# Patient Record
Sex: Female | Born: 1960
Health system: Southern US, Community
[De-identification: ages and names within clinical notes are randomized; demographics above are authoritative.]

## PROBLEM LIST (undated history)

## (undated) DIAGNOSIS — K7460 Unspecified cirrhosis of liver: Secondary | ICD-10-CM

## (undated) DIAGNOSIS — N200 Calculus of kidney: Secondary | ICD-10-CM

## (undated) DIAGNOSIS — F419 Anxiety disorder, unspecified: Secondary | ICD-10-CM

## (undated) DIAGNOSIS — N1330 Unspecified hydronephrosis: Secondary | ICD-10-CM

## (undated) DIAGNOSIS — R17 Unspecified jaundice: Secondary | ICD-10-CM

## (undated) DIAGNOSIS — K754 Autoimmune hepatitis: Secondary | ICD-10-CM

## (undated) DIAGNOSIS — N12 Tubulo-interstitial nephritis, not specified as acute or chronic: Secondary | ICD-10-CM

## (undated) HISTORY — DX: Unspecified jaundice: R17

## (undated) HISTORY — DX: Unspecified cirrhosis of liver: K74.60

## (undated) HISTORY — DX: Autoimmune hepatitis: K75.4

## (undated) HISTORY — DX: Tubulo-interstitial nephritis, not specified as acute or chronic: N12

## (undated) HISTORY — DX: Calculus of kidney: N20.0

## (undated) HISTORY — DX: Unspecified hydronephrosis: N13.30

## (undated) HISTORY — PX: LITHOTRIPSY: SUR834

---

## 1999-08-03 ENCOUNTER — Emergency Department (HOSPITAL_COMMUNITY): Admission: EM | Admit: 1999-08-03 | Discharge: 1999-08-03 | Payer: Self-pay | Admitting: Emergency Medicine

## 1999-08-03 ENCOUNTER — Encounter: Payer: Self-pay | Admitting: Emergency Medicine

## 1999-09-11 ENCOUNTER — Other Ambulatory Visit: Admission: RE | Admit: 1999-09-11 | Discharge: 1999-09-11 | Payer: Self-pay | Admitting: Obstetrics & Gynecology

## 2007-11-24 ENCOUNTER — Emergency Department (HOSPITAL_COMMUNITY): Admission: EM | Admit: 2007-11-24 | Discharge: 2007-11-24 | Payer: Self-pay | Admitting: Emergency Medicine

## 2007-11-26 ENCOUNTER — Inpatient Hospital Stay (HOSPITAL_COMMUNITY): Admission: RE | Admit: 2007-11-26 | Discharge: 2007-11-28 | Payer: Self-pay | Admitting: Urology

## 2007-12-11 ENCOUNTER — Ambulatory Visit (HOSPITAL_COMMUNITY): Admission: RE | Admit: 2007-12-11 | Discharge: 2007-12-11 | Payer: Self-pay | Admitting: Urology

## 2008-01-27 ENCOUNTER — Ambulatory Visit: Payer: Self-pay | Admitting: Internal Medicine

## 2008-01-27 DIAGNOSIS — H6980 Other specified disorders of Eustachian tube, unspecified ear: Secondary | ICD-10-CM

## 2008-01-27 DIAGNOSIS — N200 Calculus of kidney: Secondary | ICD-10-CM

## 2008-01-27 DIAGNOSIS — R030 Elevated blood-pressure reading, without diagnosis of hypertension: Secondary | ICD-10-CM | POA: Insufficient documentation

## 2011-05-15 NOTE — Op Note (Signed)
[name], [name] NO.:  [id number]   MEDICAL RECORD NO.:  [id number]          PATIENT TYPE:  OIB   LOCATION:  1415                         FACILITY:  [facility]   PHYSICIAN:  Ronald L. Earlene Plater, M.D.  DATE OF BIRTH:  [birth year]   DATE OF PROCEDURE:  11/25/2007  DATE OF DISCHARGE:                               OPERATIVE REPORT   DIAGNOSIS:  Left ureterolithiasis, with hydronephrosis and urosepsis.   OPERATIVE PROCEDURE:  Cystourethroscopy, placement of left double-J  stent, culture and sensitivity left renal pelvic urine.   SURGEON:  Lucrezia Starch. Earlene Plater, M.D.   ANESTHESIA:  LMA.   ESTIMATED BLOOD LOSS:  Negligible.   TUBES:  24 cm 7-French Contour double-pigtail stent without a string.   COMPLICATIONS:  None.   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  She was seen in the emergency room last night,  found have a 20,000 white blood cell count, was given Cipro, and the  situation worsened.  She had a fever and subsequently increased to 102  degrees Fahrenheit.  A CT scan had been performed last night and was  reviewed.  She had an upper ureteral calculus which was approximately 5  mm, and had significant bilateral nephrocalcinosis and had a  hydronephrosis on the left side.  After understanding risks, benefits,  and alternatives, she has elected to proceed with cystoscopy, placement  of left double-J stent, and IV antibiotics.  She was given Rocephin in  the office.   PROCEDURE IN DETAIL:  The patient was placed in the supine position.  After proper LMA anesthesia, she was placed in the dorsal lithotomy  position, prepped and draped with Betadine in a sterile fashion.  Cystourethroscopy was performed with a 22.5 [place] Olympus panendoscope.  The bladder was carefully inspected and noted to be without lesions.  The ureteral orifices were normal  in location, and efflux of clear urine  was noted from the right ureteral orifice.  Under fluoroscopic guidance,  a 0.038-French sensor wire was placed in the left renal pelvis, and a 6-  [place] open-ended catheter was placed past what appeared to be the  impacted 5 mm stone in the upper ureter.  Purulent material was obtained  from the left kidney, and a hydronephrotic drip was noted.  Approximately 15 mL was sent for culture and sensitivity although she  had been on antibiotics previously.  Under fluoroscopic guidance, a 24  cm, 7-French Contour double-pigtail stent was placed and noted to have  good position within the left renal pelvis  and the bladder.  No pullout string was attached.  Again, purulent  material noted to be flooding from the stent.  The bladder was drained.  The panendoscope was removed, and the patient was taken to the recovery  room stable.   PLAN:  Will be secondary treatment a stone after the urosepsis is  resolved.      Ronald L. Earlene Plater, M.D.  Electronically Signed     RLD/MEDQ  D:  11/25/2007  T:  11/26/2007  Job:  [id number]

## 2011-10-09 LAB — CULTURE, BLOOD (ROUTINE X 2)
Culture: NO GROWTH
Culture: NO GROWTH

## 2011-10-09 LAB — COMPREHENSIVE METABOLIC PANEL
ALT: 44 — ABNORMAL HIGH
AST: 40 — ABNORMAL HIGH
CO2: 23
Calcium: 8.3 — ABNORMAL LOW
Chloride: 108
Creatinine, Ser: 1.16
GFR calc Af Amer: >60
GFR calc non Af Amer: 51 — ABNORMAL LOW
Glucose, Bld: 121 — ABNORMAL HIGH
Sodium: 141
Total Bilirubin: 0.9

## 2011-10-09 LAB — DIFFERENTIAL
Basophils Absolute: 0
Eosinophils Relative: 0
Lymphocytes Relative: 1 — ABNORMAL LOW
Lymphs Abs: 0.2 — ABNORMAL LOW
Monocytes Absolute: 0.4
Neutro Abs: 19.6 — ABNORMAL HIGH

## 2011-10-09 LAB — URINE MICROSCOPIC-ADD ON

## 2011-10-09 LAB — BASIC METABOLIC PANEL
BUN: 15
BUN: 9
CO2: 24
Calcium: 9.3
Chloride: 108
Creatinine, Ser: 0.96
Creatinine, Ser: 1.19
GFR calc Af Amer: 54 — ABNORMAL LOW
GFR calc non Af Amer: 44 — ABNORMAL LOW
GFR calc non Af Amer: >60
Glucose, Bld: 132 — ABNORMAL HIGH
Glucose, Bld: 149 — ABNORMAL HIGH
Potassium: 3.2 — ABNORMAL LOW
Potassium: 3.2 — ABNORMAL LOW
Potassium: 3.9
Sodium: 141

## 2011-10-09 LAB — URINE CULTURE
Colony Count: >=100000
Special Requests: POSITIVE

## 2011-10-09 LAB — CBC
HCT: 33 — ABNORMAL LOW
HCT: 34.5 — ABNORMAL LOW
HCT: 43.5
Hemoglobin: 13.7
Hemoglobin: 14.9
MCHC: 34.5
MCHC: 34.5
MCV: 94.8
MCV: 95
Platelets: 66 — ABNORMAL LOW
Platelets: 73 — ABNORMAL LOW
RBC: 4.19
RBC: 4.54
RDW: 12.9
RDW: 13.9
WBC: 18.9 — ABNORMAL HIGH
WBC: 20.3 — ABNORMAL HIGH

## 2011-10-09 LAB — URINALYSIS, ROUTINE W REFLEX MICROSCOPIC
Bilirubin Urine: NEGATIVE
Glucose, UA: NEGATIVE
Hgb urine dipstick: NEGATIVE
Ketones, ur: 15 — AB
Leukocytes, UA: NEGATIVE
Nitrite: NEGATIVE
Protein, ur: 30 — AB
Specific Gravity, Urine: 1.02
Urobilinogen, UA: 1
pH: 7.5

## 2011-10-09 LAB — TOBRAMYCIN LEVEL, RANDOM: Tobramycin Rm: 4.1

## 2011-10-09 LAB — HCG, SERUM, QUALITATIVE: Preg, Serum: NEGATIVE

## 2014-09-06 ENCOUNTER — Emergency Department (HOSPITAL_COMMUNITY): Payer: 59

## 2014-09-06 ENCOUNTER — Encounter (HOSPITAL_COMMUNITY): Payer: Self-pay | Admitting: Emergency Medicine

## 2014-09-06 ENCOUNTER — Inpatient Hospital Stay (HOSPITAL_COMMUNITY)
Admission: EM | Admit: 2014-09-06 | Discharge: 2014-09-08 | DRG: 690 | Disposition: A | Discharge status: Home / Self Care | Payer: 59 | Attending: Internal Medicine | Admitting: Internal Medicine

## 2014-09-06 DIAGNOSIS — Z800 Family history of malignant neoplasm of digestive organs: Secondary | ICD-10-CM | POA: Diagnosis not present

## 2014-09-06 DIAGNOSIS — K7460 Unspecified cirrhosis of liver: Secondary | ICD-10-CM | POA: Diagnosis present

## 2014-09-06 DIAGNOSIS — R17 Unspecified jaundice: Secondary | ICD-10-CM | POA: Diagnosis not present

## 2014-09-06 DIAGNOSIS — N200 Calculus of kidney: Secondary | ICD-10-CM | POA: Diagnosis present

## 2014-09-06 DIAGNOSIS — N1330 Unspecified hydronephrosis: Secondary | ICD-10-CM | POA: Diagnosis present

## 2014-09-06 DIAGNOSIS — R932 Abnormal findings on diagnostic imaging of liver and biliary tract: Secondary | ICD-10-CM

## 2014-09-06 DIAGNOSIS — N132 Hydronephrosis with renal and ureteral calculous obstruction: Secondary | ICD-10-CM

## 2014-09-06 DIAGNOSIS — R030 Elevated blood-pressure reading, without diagnosis of hypertension: Secondary | ICD-10-CM | POA: Diagnosis present

## 2014-09-06 DIAGNOSIS — R7402 Elevation of levels of lactic acid dehydrogenase (LDH): Secondary | ICD-10-CM

## 2014-09-06 DIAGNOSIS — N12 Tubulo-interstitial nephritis, not specified as acute or chronic: Secondary | ICD-10-CM | POA: Diagnosis not present

## 2014-09-06 DIAGNOSIS — K759 Inflammatory liver disease, unspecified: Secondary | ICD-10-CM

## 2014-09-06 DIAGNOSIS — R740 Nonspecific elevation of levels of transaminase and lactic acid dehydrogenase [LDH]: Secondary | ICD-10-CM

## 2014-09-06 LAB — COMPREHENSIVE METABOLIC PANEL
ALT: 932 U/L — AB (ref 0–35)
ANION GAP: 12 (ref 5–15)
AST: 1051 U/L — ABNORMAL HIGH (ref 0–37)
Albumin: 3.5 g/dL (ref 3.5–5.2)
Alkaline Phosphatase: 174 U/L — ABNORMAL HIGH (ref 39–117)
BUN: 8 mg/dL (ref 6–23)
CALCIUM: 9.1 mg/dL (ref 8.4–10.5)
CO2: 24 meq/L (ref 19–32)
CREATININE: 0.82 mg/dL (ref 0.50–1.10)
Chloride: 101 mEq/L (ref 96–112)
GFR, EST NON AFRICAN AMERICAN: 81 mL/min — AB (ref >90)
GLUCOSE: 108 mg/dL — AB (ref 70–99)
Potassium: 3.7 mEq/L (ref 3.7–5.3)
SODIUM: 137 meq/L (ref 137–147)
TOTAL PROTEIN: 9.9 g/dL — AB (ref 6.0–8.3)
Total Bilirubin: 10.5 mg/dL — ABNORMAL HIGH (ref 0.3–1.2)

## 2014-09-06 LAB — CBC WITH DIFFERENTIAL/PLATELET
Basophils Absolute: 0.1 10*3/uL (ref 0.0–0.1)
Basophils Relative: 1 % (ref 0–1)
EOS ABS: 0.1 10*3/uL (ref 0.0–0.7)
EOS PCT: 3 % (ref 0–5)
HEMATOCRIT: 36.6 % (ref 36.0–46.0)
Hemoglobin: 13.1 g/dL (ref 12.0–15.0)
LYMPHS ABS: 0.8 10*3/uL (ref 0.7–4.0)
Lymphocytes Relative: 19 % (ref 12–46)
MCH: 32.2 pg (ref 26.0–34.0)
MCHC: 35.8 g/dL (ref 30.0–36.0)
MCV: 89.9 fL (ref 78.0–100.0)
MONO ABS: 0.4 10*3/uL (ref 0.1–1.0)
Monocytes Relative: 9 % (ref 3–12)
Neutro Abs: 2.9 10*3/uL (ref 1.7–7.7)
Neutrophils Relative %: 68 % (ref 43–77)
PLATELETS: 245 10*3/uL (ref 150–400)
RBC: 4.07 MIL/uL (ref 3.87–5.11)
RDW: 15.2 % (ref 11.5–15.5)
WBC: 4.3 10*3/uL (ref 4.0–10.5)

## 2014-09-06 LAB — URINALYSIS, ROUTINE W REFLEX MICROSCOPIC
Glucose, UA: NEGATIVE mg/dL
KETONES UR: 15 mg/dL — AB
NITRITE: POSITIVE — AB
PROTEIN: 30 mg/dL — AB
Specific Gravity, Urine: 1.013 (ref 1.005–1.030)
UROBILINOGEN UA: 1 mg/dL (ref 0.0–1.0)
pH: 7 (ref 5.0–8.0)

## 2014-09-06 LAB — URINE MICROSCOPIC-ADD ON

## 2014-09-06 LAB — LIPASE, BLOOD: LIPASE: 31 U/L (ref 11–59)

## 2014-09-06 MED ORDER — IOHEXOL 300 MG/ML  SOLN
25.0000 mL | INTRAMUSCULAR | Status: AC
  Administered 2014-09-06: 25 mL via ORAL

## 2014-09-06 MED ORDER — IOHEXOL 300 MG/ML  SOLN
100.0000 mL | Freq: Once | INTRAMUSCULAR | Status: AC | PRN
  Administered 2014-09-06: 100 mL via INTRAVENOUS

## 2014-09-06 MED ORDER — DEXTROSE 5 % IV SOLN
1.0000 g | Freq: Once | INTRAVENOUS | Status: AC
  Administered 2014-09-06: 1 g via INTRAVENOUS
  Filled 2014-09-06: qty 10

## 2014-09-06 MED ORDER — SODIUM CHLORIDE 0.9 % IV BOLUS (SEPSIS)
500.0000 mL | INTRAVENOUS | Status: AC
  Administered 2014-09-06: 500 mL via INTRAVENOUS

## 2014-09-06 NOTE — ED Notes (Signed)
Per pt sts she noticed that she has been jaundice over the past few days. sts she has been having nausea and abdominal pain after eating.

## 2014-09-06 NOTE — H&P (Signed)
[facility] History and Physical  SACHE SANE ZOX:[id number] DOB: Jul 20, 1961 DOA: 09/06/2014  Referring physician: ED physician PCP: No PCP Per Patient  Specialists:   Chief Complaint: Jaundice HPI: Rose Williams is a 53 y.o. female with PMH of kidney stone (p/s lithotripsy and stent placement), who presents with jaundice.  Patient has been doing well without taking any medications at home. In the past 2 or 3 days, she noticed that her skin and eyes become yellow. She does not have any fever, chills, nausea, vomiting and abdominal pain. No hx of blood transfusion. She came to ED for further evaluation. CT-abdomen showed changes consistent with hepatic cirrhosis with mild splenic enlargement.   CT-abdomen also showed possible severe pyelonephritis and ureteritis with left kidney hydronephrosis and multiple stones. Her creatinine is normal on BMP. Patient denies dysuria or burning on urination, but the reporting mild irritation on urination. UA is positive for UTI. Per radiology, infiltrating neoplasm or lymphoma is on the differential list. CMP showed elevation of AST, ALT, total bilirubin and ALP.  Ceftriaxone was started by ED, and patient is admitted to MedSurg bed for further evaluation.  Review of Systems: As presented in the history of presenting illness, rest negative.  Where does patient live?  Lives at home with her husbanc [place] [address] Can patient participate in ADLs? Yes  Allergy:  Allergies  Allergen Reactions  . Other Itching    Itching from melons, cantaloupe, eggplant  . Pseudoephedrine Other (See Comments)    tachycardia    History reviewed. No pertinent past medical history.  Past Surgical History  Procedure Laterality Date  . Lithotripsy      2008    Social History:  reports that she has never smoked. She does not have any smokeless tobacco history on file. She reports that she does not drink alcohol. Not using drug.  Family History:   Family History  Problem Relation Age of Onset  . Cancer Mother     liver cancer, died of liver cancer at age of 52  . Cystic fibrosis Child     2 daughter and 1 son  . Kidney Stones      runging in her family, including 2 brothers and 2 sisters     Prior to Admission medications   Not on File    Physical Exam: Filed Vitals:   09/06/14 2000 09/06/14 2015 09/06/14 2030 09/06/14 2356  BP: 150/93 132/83 132/84 149/92  Pulse: 78 90 90 86  Temp:      TempSrc:      Resp:      SpO2: 100% 100% 100%    General: Not in acute distress HEENT:       Eyes: PERRL, EOMI, positive for scleral icterus       ENT: No discharge from the ears and nose, no pharynx injection, no tonsillar enlargement.        Neck: No JVD, no bruit, no mass felt. Cardiac: S1/S2, RRR, No murmurs, gallops or rubs Pulm: Good air movement bilaterally. Clear to auscultation bilaterally. No rales, wheezing, rhonchi or rubs. Abd: Soft, nondistended, nontender, no rebound pain, no organomegaly, BS present. No CVA tenderness. No Murphy sign Ext: No edema. 2+DP/PT pulse bilaterally Musculoskeletal: No joint deformities, erythema, or stiffness, ROM full Skin: No rashes.  Neuro: Alert and oriented X3, cranial nerves II-XII grossly intact, muscle strength 5/5 in all extremeties, sensation to light touch intact. Brachial reflex 2+ bilaterally. Knee reflex 1+ bilaterally. Negative Psych: Patient is not  psychotic, no suicidal or hemocidal ideation.  Labs on Admission:  Basic Metabolic Panel:  Recent Labs Lab 09/06/14 1603  NA 137  K 3.7  CL 101  CO2 24  GLUCOSE 108*  BUN 8  CREATININE 0.82  CALCIUM 9.1   Liver Function Tests:  Recent Labs Lab 09/06/14 1603  AST 1051*  ALT 932*  ALKPHOS 174*  BILITOT 10.5*  PROT 9.9*  ALBUMIN 3.5    Recent Labs Lab 09/06/14 1603  LIPASE 31   No results found for this basename: AMMONIA,  in the last 168 hours CBC:  Recent Labs Lab 09/06/14 1603  WBC 4.3  NEUTROABS  2.9  HGB 13.1  HCT 36.6  MCV 89.9  PLT 245   Cardiac Enzymes: No results found for this basename: CKTOTAL, CKMB, CKMBINDEX, TROPONINI,  in the last 168 hours  BNP (last 3 results) No results found for this basename: PROBNP,  in the last 8760 hours CBG: No results found for this basename: GLUCAP,  in the last 168 hours  Radiological Exams on Admission: Ct Abdomen Pelvis W Contrast  09/06/2014   CLINICAL DATA:  Nausea. Hepatic dysfunction. Jaundice. Pain and nausea after eating.  EXAM: CT ABDOMEN AND PELVIS WITH CONTRAST  TECHNIQUE: Multidetector CT imaging of the abdomen and pelvis was performed using the standard protocol following bolus administration of intravenous contrast.  CONTRAST:  OMNIPAQUE IOHEXOL 300 MG/ML  SOLN  COMPARISON:  11/24/2007  FINDINGS: Lung bases are clear.  Cirrhotic configuration of the liver with enlarged lateral segment of the left lobe and nodular contour. No focal liver lesions demonstrated. Spleen is mildly enlarged. Small accessory spleen. The gallbladder is normal. No bile duct dilatation. Pancreas, adrenal glands, abdominal aorta, and inferior vena cava are unremarkable. The kidneys are distinctly abnormal. The right kidney is diffusely enlarged with abnormal nephrogram. The renal pelvis and ureter are not distended but there is prominent wall thickening throughout the ureter and of the pelvocaliceal system with infiltration of the renal sinus fat. This does not appear to represent a discrete mass. There are small cysts present. Appearance is most likely to represent severe pyelonephritis although infiltrating neoplasm or lymphoma is not excluded. The left kidney demonstrates hydronephrosis without hydroureter. Mild thickening of the ureteral wall and pyelocaliceal system. Diffuse parenchymal atrophy. 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 Calcifications demonstrated throughout the left kidney consistent with stones and  seen on previous study. Prominent retroperitoneal lymph nodes mostly in the periaortic region and also in the celiac axis. Lymph nodes measure up to about 22 mm short axis dimension. These are nonspecific and could represent reactive or inflammatory nodes versus lymphoproliferative change or neoplasm. The stomach, small bowel, and colon are unremarkable. Contrast material flows through to the colon without evidence of bowel obstruction. No free air or free fluid in the abdomen.  Pelvis: Bladder wall is not thickened. Uterus and ovaries are not enlarged. Appendix is normal. No free or loculated pelvic fluid collections. No pelvic lymphadenopathy. No destructive bone lesions.  IMPRESSION: Kidneys and ureters are abnormal bilaterally. Right kidney demonstrates diffuse enlargement with abnormal nephrogram and with prominent wall thickening and infiltration around the right renal pelvis and ureter. Consider severe pyelonephritis and ureteritis versus infiltrating neoplasm or lymphoma.  Left kidney demonstrates hydronephrosis with multiple stones, thickening of the urothelium, and parenchymal atrophy. Changes may represent pyelonephritis or reflux nephropathy. Changes of the left may represent a more chronic stage of similar changes on the right.  Changes of hepatic cirrhosis  with mild splenic enlargement.  Nonspecific lymphadenopathy in the retroperitoneum and celiac axis.   Electronically Signed   By: Burman Nieves M.D.   On: 09/06/2014 22:40    Assessment/Plan Principal Problem:   Pyelonephritis Active Problems:   Calculus of kidney   ELEVATED BLOOD PRESSURE WITHOUT DIAGNOSIS OF HYPERTENSION   Jaundice   Hydronephrosis   1. Pyelonephritis: CT abdomen showed that patient is likely to have pyelonephritis. Patient reports that she had a history of pyelonephritis in the past possibly due to kidney stones. She did not have typical symptoms for pyelonephritis, such as tenderness over CVA and dysuria in the  past. Her urinalysis is consistent with UTI. Currently patient is hemodynamically stable, no sepsis. IV ceftriaxone was started in ED.  -will admit to regular bed - IVF - IV ceftriaxone - follow urine culture and blood culture X 2. - may consult to urology for kidney stones.  2. kidney stone and hydronephrosis: Patient has a long history of kidney stone. She had lithotripsy and stent placement in the past. Now has hydronephrosis again. Currently her renal function is fine. Per radiology, infiltrating neoplasm or lymphoma is also on the differential list. - May consult to urology or follow up with urology at discharge  3. Jaundice:  Patient has a jaundice with elevated transaminase. Patient does not have history of alcohol drinking. No history of hepatitis. The etiology is not clear. The etiology is clear.  -will get hepatitis panel to begin with - check INR - HIV ab - direct BR - UDS - may need to consult GI if work up is not revealing.   DVT ppx: SQ Heparin   Code Status: Full code Family Communication: patient's husband at bed side Disposition Plan: Admit to inpatient, Med-Surge  Rose Williams [facility] Pager [phone]  If 7PM-7AM, please contact night-coverage [email or web address] Password [facility] 09/07/2014, 12:17 AM

## 2014-09-06 NOTE — Progress Notes (Signed)
Called ED for report & said they will cal me back

## 2014-09-06 NOTE — ED Notes (Signed)
Pt to CT at this time.  Family remains at bedside. 

## 2014-09-06 NOTE — ED Provider Notes (Signed)
CSN: [id number]     Arrival date & time 09/06/14  1534 History   First MD Initiated Contact with Patient 09/06/14 1827     Chief Complaint  Patient presents with  . Jaundice     (Consider location/radiation/quality/duration/timing/severity/associated sxs/prior Treatment) HPI Patient presents with concerns of nausea, jaundice. Patient also complains of postprandial abdominal discomfort, nausea.  There is associated anorexia, though this seems secondary to fear of postprandial discomfort. Symptoms have developed over the past few days, without, exercise, activity, travel. Patient states that she is generally well. She does have a family history of hepatic cancer, mother. No clear alleviating or exacerbating factors.    History reviewed. No pertinent past medical history. History reviewed. No pertinent past surgical history. History reviewed. No pertinent family history. History  Substance Use Topics  . Smoking status: Never Smoker   . Smokeless tobacco: Not on file  . Alcohol Use: No   OB History   Grav Para Term Preterm Abortions TAB SAB Ect Mult Living                 Review of Systems  Constitutional:       Per HPI, otherwise negative  HENT:       Per HPI, otherwise negative  Respiratory:       Per HPI, otherwise negative  Cardiovascular:       Per HPI, otherwise negative  Gastrointestinal: Negative for vomiting.  Endocrine:       Negative aside from HPI  Genitourinary:       Neg aside from HPI   Musculoskeletal:       Per HPI, otherwise negative  Skin: Positive for color change.  Neurological: Negative for syncope.      Allergies  Pseudoephedrine  Home Medications   Prior to Admission medications   Not on File   BP 156/103  Pulse 130  Temp(Src) 98.5 F (36.9 C) (Oral)  Resp 18  SpO2 98% Physical Exam  Nursing note and vitals reviewed. Constitutional: She is oriented to person, place, and time. She appears well-developed and well-nourished. No  distress.  HENT:  Head: Normocephalic and atraumatic.  Eyes: Conjunctivae and EOM are normal. Scleral icterus is present.  Cardiovascular: Normal rate and regular rhythm.   Pulmonary/Chest: Effort normal and breath sounds normal. No stridor. No respiratory distress.  Abdominal: She exhibits no distension.  Musculoskeletal: She exhibits no edema.  Neurological: She is alert and oriented to person, place, and time. No cranial nerve deficit. She exhibits normal muscle tone. Coordination normal.  No asterixis  Skin: Skin is warm and dry.  Gross jaundice  Psychiatric: She has a normal mood and affect.    ED Course  Procedures (including critical care time) Labs Review Labs Reviewed  COMPREHENSIVE METABOLIC PANEL - Abnormal; Notable for the following:    Glucose, Bld 108 (*)    Total Protein 9.9 (*)    AST 1051 (*)    ALT 932 (*)    Alkaline Phosphatase 174 (*)    Total Bilirubin 10.5 (*)    GFR calc non Af Amer 81 (*)    All other components within normal limits  URINALYSIS, ROUTINE W REFLEX MICROSCOPIC - Abnormal; Notable for the following:    Color, Urine ORANGE (*)    APPearance CLOUDY (*)    Hgb urine dipstick TRACE (*)    Bilirubin Urine LARGE (*)    Ketones, ur 15 (*)    Protein, ur 30 (*)    Nitrite POSITIVE (*)  Leukocytes, UA LARGE (*)    All other components within normal limits  URINE MICROSCOPIC-ADD ON - Abnormal; Notable for the following:    Squamous Epithelial / LPF FEW (*)    All other components within normal limits  CBC WITH DIFFERENTIAL  LIPASE, BLOOD    Imaging Review Ct Abdomen Pelvis W Contrast  09/06/2014   CLINICAL DATA:  Nausea. Hepatic dysfunction. Jaundice. Pain and nausea after eating.  EXAM: CT ABDOMEN AND PELVIS WITH CONTRAST  TECHNIQUE: Multidetector CT imaging of the abdomen and pelvis was performed using the standard protocol following bolus administration of intravenous contrast.  CONTRAST:  OMNIPAQUE IOHEXOL 300 MG/ML  SOLN   COMPARISON:  11/24/2007  FINDINGS: Lung bases are clear.  Cirrhotic configuration of the liver with enlarged lateral segment of the left lobe and nodular contour. No focal liver lesions demonstrated. Spleen is mildly enlarged. Small accessory spleen. The gallbladder is normal. No bile duct dilatation. Pancreas, adrenal glands, abdominal aorta, and inferior vena cava are unremarkable. The kidneys are distinctly abnormal. The right kidney is diffusely enlarged with abnormal nephrogram. The renal pelvis and ureter are not distended but there is prominent wall thickening throughout the ureter and of the pelvocaliceal system with infiltration of the renal sinus fat. This does not appear to represent a discrete mass. There are small cysts present. Appearance is most likely to represent severe pyelonephritis although infiltrating neoplasm or lymphoma is not excluded. The left kidney demonstrates hydronephrosis without hydroureter. Mild thickening of the ureteral wall and pyelocaliceal system. Diffuse parenchymal atrophy. 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 Calcifications demonstrated throughout the left kidney consistent with stones and seen on previous study. Prominent retroperitoneal lymph nodes mostly in the periaortic region and also in the celiac axis. Lymph nodes measure up to about 22 mm short axis dimension. These are nonspecific and could represent reactive or inflammatory nodes versus lymphoproliferative change or neoplasm. The stomach, small bowel, and colon are unremarkable. Contrast material flows through to the colon without evidence of bowel obstruction. No free air or free fluid in the abdomen.  Pelvis: Bladder wall is not thickened. Uterus and ovaries are not enlarged. Appendix is normal. No free or loculated pelvic fluid collections. No pelvic lymphadenopathy. No destructive bone lesions.  IMPRESSION: Kidneys and ureters are abnormal bilaterally. Right kidney  demonstrates diffuse enlargement with abnormal nephrogram and with prominent wall thickening and infiltration around the right renal pelvis and ureter. Consider severe pyelonephritis and ureteritis versus infiltrating neoplasm or lymphoma.  Left kidney demonstrates hydronephrosis with multiple stones, thickening of the urothelium, and parenchymal atrophy. Changes may represent pyelonephritis or reflux nephropathy. Changes of the left may represent a more chronic stage of similar changes on the right.  Changes of hepatic cirrhosis with mild splenic enlargement.  Nonspecific lymphadenopathy in the retroperitoneum and celiac axis.   Electronically Signed   By: Burman Nieves M.D.   On: 09/06/2014 22:40   I reviewed the CT imaging, grouped interpretation.  I discussed imaging, and demonstrated the pictures to the patient and her husband. MDM   Patient presents with abdominal discomfort, postprandial nausea, jaundice. Patient is notably abnormal labs, indicating hepatic dysfunction, with hyperbilirubinemia. Patient also has evidence of urinary tract infection.  Patient's CT suggests either inflammatory condition, possibly with hepatobiliary dysfunction versus inflammation. Patient is hemodynamically stable aside from mild tachycardia. Patient started on antibiotics for her pyelonephritis, but required admission for further evaluation, management of her complex abdominal pain, hepatic dysfunction, kidney abnormalities.  Gerhard Munch, MD 09/06/14 [phone]

## 2014-09-06 NOTE — ED Notes (Signed)
Pt drinking contrast for CT at this time.  Family at bedside

## 2014-09-06 NOTE — ED Notes (Signed)
Admitting MD in to access pt for admission

## 2014-09-06 NOTE — ED Notes (Signed)
Pt finished drinking contrast, CT made aware.

## 2014-09-07 ENCOUNTER — Encounter (HOSPITAL_COMMUNITY): Payer: Self-pay | Admitting: Internal Medicine

## 2014-09-07 DIAGNOSIS — R740 Nonspecific elevation of levels of transaminase and lactic acid dehydrogenase [LDH]: Secondary | ICD-10-CM

## 2014-09-07 DIAGNOSIS — R17 Unspecified jaundice: Secondary | ICD-10-CM

## 2014-09-07 DIAGNOSIS — R7402 Elevation of levels of lactic acid dehydrogenase (LDH): Secondary | ICD-10-CM

## 2014-09-07 DIAGNOSIS — R932 Abnormal findings on diagnostic imaging of liver and biliary tract: Secondary | ICD-10-CM

## 2014-09-07 DIAGNOSIS — K759 Inflammatory liver disease, unspecified: Secondary | ICD-10-CM

## 2014-09-07 LAB — HEPATITIS PANEL, ACUTE
HCV Ab: NEGATIVE
HEP B C IGM: NONREACTIVE
Hep A IgM: NONREACTIVE
Hepatitis B Surface Ag: NEGATIVE

## 2014-09-07 LAB — COMPREHENSIVE METABOLIC PANEL
ALK PHOS: 141 U/L — AB (ref 39–117)
ALT: 735 U/L — AB (ref 0–35)
AST: 824 U/L — AB (ref 0–37)
Albumin: 2.8 g/dL — ABNORMAL LOW (ref 3.5–5.2)
Anion gap: 11 (ref 5–15)
BUN: 9 mg/dL (ref 6–23)
CO2: 23 meq/L (ref 19–32)
Calcium: 8.3 mg/dL — ABNORMAL LOW (ref 8.4–10.5)
Chloride: 101 mEq/L (ref 96–112)
Creatinine, Ser: 0.75 mg/dL (ref 0.50–1.10)
GLUCOSE: 94 mg/dL (ref 70–99)
Potassium: 3.7 mEq/L (ref 3.7–5.3)
SODIUM: 135 meq/L — AB (ref 137–147)
Total Bilirubin: 9.2 mg/dL — ABNORMAL HIGH (ref 0.3–1.2)
Total Protein: 8.3 g/dL (ref 6.0–8.3)

## 2014-09-07 LAB — RAPID URINE DRUG SCREEN, HOSP PERFORMED
AMPHETAMINES: NOT DETECTED
Barbiturates: NOT DETECTED
Benzodiazepines: NOT DETECTED
Cocaine: NOT DETECTED
OPIATES: NOT DETECTED
Tetrahydrocannabinol: NOT DETECTED

## 2014-09-07 LAB — BILIRUBIN, DIRECT: Bilirubin, Direct: 6.8 mg/dL — ABNORMAL HIGH (ref 0.0–0.3)

## 2014-09-07 LAB — HIV ANTIBODY (ROUTINE TESTING W REFLEX): HIV 1&2 Ab, 4th Generation: NONREACTIVE

## 2014-09-07 LAB — PROTIME-INR
INR: 1.25 (ref 0.00–1.49)
Prothrombin Time: 15.7 seconds — ABNORMAL HIGH (ref 11.6–15.2)

## 2014-09-07 MED ORDER — HEPARIN SODIUM (PORCINE) 5000 UNIT/ML IJ SOLN
5000.0000 [IU] | Freq: Three times a day (TID) | INTRAMUSCULAR | Status: DC
  Administered 2014-09-07 (×2): 5000 [IU] via SUBCUTANEOUS
  Filled 2014-09-07 (×6): qty 1

## 2014-09-07 MED ORDER — ONDANSETRON HCL 4 MG/2ML IJ SOLN: 4.0000 mg | Freq: Four times a day (QID) | INTRAMUSCULAR | Status: DC | PRN

## 2014-09-07 MED ORDER — ONDANSETRON HCL 4 MG PO TABS: 4.0000 mg | ORAL_TABLET | Freq: Four times a day (QID) | ORAL | Status: DC | PRN

## 2014-09-07 MED ORDER — SODIUM CHLORIDE 0.9 % IV SOLN: INTRAVENOUS | Status: DC

## 2014-09-07 MED ORDER — CETYLPYRIDINIUM CHLORIDE 0.05 % MT LIQD: 7.0000 mL | Freq: Two times a day (BID) | OROMUCOSAL | Status: DC

## 2014-09-07 MED ORDER — DEXTROSE 5 % IV SOLN
1.0000 g | Freq: Every day | INTRAVENOUS | Status: DC
  Administered 2014-09-07 – 2014-09-08 (×2): 1 g via INTRAVENOUS
  Filled 2014-09-07 (×2): qty 10

## 2014-09-07 MED ORDER — SODIUM CHLORIDE 0.9 % IV SOLN
INTRAVENOUS | Status: DC
  Administered 2014-09-07 – 2014-09-08 (×4): via INTRAVENOUS

## 2014-09-07 MED ORDER — ONDANSETRON HCL 4 MG/2ML IJ SOLN: 4.0000 mg | Freq: Three times a day (TID) | INTRAMUSCULAR | Status: DC | PRN

## 2014-09-07 MED ORDER — CHLORHEXIDINE GLUCONATE 0.12 % MT SOLN
15.0000 mL | Freq: Two times a day (BID) | OROMUCOSAL | Status: DC
  Administered 2014-09-07: 15 mL via OROMUCOSAL
  Filled 2014-09-07 (×6): qty 15

## 2014-09-07 NOTE — Progress Notes (Signed)
Received report from ED nurse

## 2014-09-07 NOTE — Progress Notes (Signed)
Utilization review completed.  

## 2014-09-07 NOTE — Progress Notes (Signed)
NURSING PROGRESS NOTE  Rose Williams [id number] Admission Data: 09/07/2014 2:42 AM Attending Provider: Eduard Clos, MD PCP:No PCP Per Patient Code Status: full code   Rose Williams is a 53 y.o. female patient admitted from ED:  -No acute distress noted.  -No complaints of shortness of breath.  -No complaints of chest pain.   Cardiac Monitoring: Box # N/A in place. Cardiac monitor yields;N/A.  Blood pressure 150/92, pulse 89, temperature 98.5 F (36.9 C), temperature source Oral, resp. rate 15, height  (1.676 m), weight 56.7 kg (125 lb), SpO2 100.00%.   IV Fluids:  IV in place, occlusive dsg intact without redness, IV cath antecubital left, condition patent and no redness normal saline.  CC/HR  Allergies:  Other and Pseudoephedrine  Past Medical History:   has no past medical history on file.  Past Surgical History:   has past surgical history that includes Lithotripsy.  Social History:   reports that she has never smoked. She does not have any smokeless tobacco history on file. She reports that she does not drink alcohol.  Skin: NSI  Patient/Family orientated to room. Information packet given to patient/family. Admission inpatient armband information verified with patient/family to include name and date of birth and placed on patient arm. Side rails up x 2, fall assessment and education completed with patient/family. Patient/family able to verbalize understanding of risk associated with falls and verbalized understanding to call for assistance before getting out of bed. Call light within reach. Patient/family able to voice and demonstrate understanding of unit orientation instructions.    Will continue to evaluate and treat per MD orders.

## 2014-09-07 NOTE — Consult Note (Signed)
Referring Provider: [facility] Primary Care Physician:  No PCP Per Patient Primary Gastroenterologist:  unassigned  Reason for Consultation:  Abnormal liver enzymes    HPI: Rose Williams is a 53 y.o. female who was admitted to the hospital yesterday with jaundice. She has a history of kidney stones and is status post lithotripsy and stent placement several years ago. She states she has felt fine since that time. 11 days ago she had a fast food restaurant and had chicken and bisquits. The following day she went out with several friends and had [place] food. Approximately 2 days after that, she began to notice that she was turning yellow. She is a Engineer, civil (consulting) and felt that her sclera were becoming yellow and her skin was turning darker. She asked her family if they notice any discoloration and he did not. She denied any complaints of fever, chills, nausea, vomiting, or abdominal pain. She denies alcohol use and denies any history of intravenous drug use or intranasal cocaine. She has no tattoos and has no history of blood transfusions .CT of the abdomen on admission showed changes consistent with hepatic cirrhosis and mild splenic enlargement. CMP showed elevation of AST, ALT, total bilirubin, and alkaline phosphatase. Creatinine was normal. She denies any recent travel outside the [place]. She has no prior history of hepatitis. She states that her mother died of liver cancer.    History reviewed. No pertinent past medical history.  Past Surgical History  Procedure Laterality Date  . Lithotripsy      2008    Prior to Admission medications   Not on File    Current Facility-Administered Medications  Medication Dose Route Frequency Provider Last Rate Last Dose  . 0.9 %  sodium chloride infusion   Intravenous Continuous Lorretta Harp, MD 100 mL/hr at 09/07/14 1127    . antiseptic oral rinse (CPC / CETYLPYRIDINIUM CHLORIDE 0.05%) solution 7 mL  7 mL Mouth Rinse q12n4p Eduard Clos, MD      . cefTRIAXone (ROCEPHIN) 1 g in dextrose 5 % 50 mL IVPB  1 g Intravenous QHS Lorretta Harp, MD      . chlorhexidine (PERIDEX) 0.12 % solution 15 mL  15 mL Mouth Rinse BID Eduard Clos, MD   15 mL at 09/07/14 0815  . heparin injection 5,000 Units  5,000 Units Subcutaneous 3 times per day Lorretta Harp, MD      . ondansetron [facility]) tablet 4 mg  4 mg Oral Q6H PRN Lorretta Harp, MD       Or  . ondansetron [facility]) injection 4 mg  4 mg Intravenous Q6H PRN Lorretta Harp, MD        Allergies as of 09/06/2014 - Review Complete 09/06/2014  Allergen Reaction Noted  . Other Itching 09/06/2014  . Pseudoephedrine Other (See Comments)     Family History  Problem Relation Age of Onset  . Cancer Mother     liver cancer, died of liver cancer at age of 62  . Cystic fibrosis Child     2 daughter and 1 son  . Kidney Stones      runging in her family, including 2 brothers and 2 sisters    History   Social History  . Marital Status: Married    Spouse Name: N/A    Number of Children: N/A  . Years of Education: N/A   Occupational History  . Not on file.   Social History Main Topics  . Smoking status: Never Smoker   .  Smokeless tobacco: Not on file  . Alcohol Use: No  . Drug Use: Not on file  . Sexual Activity: Not on file   Other Topics Concern  . Not on file   Social History Narrative  . No narrative on file    Review of Systems: Gen: Denies any fever, chills, sweats, anorexia, fatigue, weakness, malaise, weight loss, and sleep disorder CV: Denies chest pain, angina, palpitations, syncope, orthopnea, PND, peripheral edema, and claudication. Resp: Denies dyspnea at rest, dyspnea with exercise, cough, sputum, wheezing, coughing up blood, and pleurisy. GI: Denies vomiting blood, and fecal incontinence.   Denies dysphagia or odynophagia.Admits to juandice GU : Denies urinary burning, blood in urine, urinary frequency, urinary hesitancy, nocturnal urination, and urinary  incontinence. MS: Denies joint pain, limitation of movement, and swelling, stiffness, low back pain, extremity pain. Denies muscle weakness, cramps, atrophy.  Derm: Denies rash, itching, dry skin, hives, moles, warts, or unhealing ulcers.  Psych: Denies depression, anxiety, memory loss, suicidal ideation, hallucinations, paranoia, and confusion. Heme: Denies bruising, bleeding, and enlarged lymph nodes. Neuro:  Denies any headaches, dizziness, paresthesias. Endo:  Denies any problems with DM, thyroid, adrenal function.  Physical Exam: Vital signs in last 24 hours: Temp:  [98 F (36.7 C)-98.5 F (36.9 C)] 98 F (36.7 C) (09/08 0615) Pulse Rate:  [78-130] 99 (09/08 0615) Resp:  [15-18] 15 (09/07 1910) BP: (130-156)/(68-103) 130/68 mmHg (09/08 0615) SpO2:  [98 %-100 %] 100 % (09/08 0615) Weight:  [56.7 kg (125 lb)] 56.7 kg (125 lb) (09/08 0040) Last BM Date: 09/06/14 General:   Alert,  Well-developed, well-nourished, pleasant and cooperative in NAD, sclera icteric. Head:  Normocephalic and atraumatic. Eyes:  Sclera mildly icteric.   Conjunctiva pink. Ears:  Normal auditory acuity. Nose:  No deformity, discharge,  or lesions. Mouth:  No deformity or lesions.   Neck:  Supple; no masses or thyromegaly. Lungs:  Clear throughout to auscultation.   No wheezes, crackles, or rhonchi.  Heart:  Regular rate and rhythm; no murmurs, clicks, rubs,  or gallops. Abdomen:  Soft,nontender, BS active,nonpalp mass or hsm.   Rectal:  Deferred  Msk:  Symmetrical without gross deformities. . Pulses:  Normal pulses noted. Extremities:  Without clubbing or edema. Neurologic:  Alert and  oriented x4;  grossly normal neurologically. Skin:  Intact without significant lesions or rashes.. Psych:  Alert and cooperative. Normal mood and affect.    Lab Results:  Recent Labs  09/06/14 1603  WBC 4.3  HGB 13.1  HCT 36.6  PLT 245   BMET  Recent Labs  09/06/14 1603 09/07/14 0150  NA 137 135*  K 3.7  3.7  CL 101 101  CO2 24 23  GLUCOSE 108* 94  BUN 8 9  CREATININE 0.82 0.75  CALCIUM 9.1 8.3*   LFT  Recent Labs  09/07/14 0150  PROT 8.3  ALBUMIN 2.8*  AST 824*  ALT 735*  ALKPHOS 141*  BILITOT 9.2*  BILIDIR 6.8*   PT/INR  Recent Labs  09/07/14 0150  LABPROT 15.7*  INR 1.25   Hepatitis Panel  Recent Labs  09/07/14 0150  HEPBSAG NEGATIVE  HCVAB NEGATIVE  HEPAIGM NON REACTIVE  HEPBIGM NON REACTIVE   HIV  nonreactive UDS  negative LIPASE 31 Studies/Results: Ct Abdomen Pelvis W Contrast  09/06/2014   CLINICAL DATA:  Nausea. Hepatic dysfunction. Jaundice. Pain and nausea after eating.  EXAM: CT ABDOMEN AND PELVIS WITH CONTRAST  TECHNIQUE: Multidetector CT imaging of the abdomen and pelvis was performed using the  standard protocol following bolus administration of intravenous contrast.  CONTRAST:  OMNIPAQUE IOHEXOL 300 MG/ML  SOLN  COMPARISON:  11/24/2007  FINDINGS: Lung bases are clear.  Cirrhotic configuration of the liver with enlarged lateral segment of the left lobe and nodular contour. No focal liver lesions demonstrated. Spleen is mildly enlarged. Small accessory spleen. The gallbladder is normal. No bile duct dilatation. Pancreas, adrenal glands, abdominal aorta, and inferior vena cava are unremarkable. The kidneys are distinctly abnormal. The right kidney is diffusely enlarged with abnormal nephrogram. The renal pelvis and ureter are not distended but there is prominent wall thickening throughout the ureter and of the pelvocaliceal system with infiltration of the renal sinus fat. This does not appear to represent a discrete mass. There are small cysts present. Appearance is most likely to represent severe pyelonephritis although infiltrating neoplasm or lymphoma is not excluded. The left kidney demonstrates hydronephrosis without hydroureter. Mild thickening of the ureteral wall and pyelocaliceal system. Diffuse parenchymal atrophy. Changes likely represent reflux  nephropathy, probably also with pyelonephritis. Calcifications demonstrated throughout the left kidney consistent with stones and seen on previous study. Prominent retroperitoneal lymph nodes mostly in the periaortic region and also in the celiac axis. Lymph nodes measure up to about 22 mm short axis dimension. These are nonspecific and could represent reactive or inflammatory nodes versus lymphoproliferative change or neoplasm. The stomach, small bowel, and colon are unremarkable. Contrast material flows through to the colon without evidence of bowel obstruction. No free air or free fluid in the abdomen.  Pelvis: Bladder wall is not thickened. Uterus and ovaries are not enlarged. Appendix is normal. No free or loculated pelvic fluid collections. No pelvic lymphadenopathy. No destructive bone lesions.  IMPRESSION: Kidneys and ureters are abnormal bilaterally. Right kidney demonstrates diffuse enlargement with abnormal nephrogram and with prominent wall thickening and infiltration around the right renal pelvis and ureter. Consider severe pyelonephritis and ureteritis versus infiltrating neoplasm or lymphoma.  Left kidney demonstrates hydronephrosis with multiple stones, thickening of the urothelium, and parenchymal atrophy. Changes may represent pyelonephritis or reflux nephropathy. Changes of the left may represent a more chronic stage of similar changes on the right.  Changes of hepatic cirrhosis with mild splenic enlargement.  Nonspecific lymphadenopathy in the retroperitoneum and celiac axis.   Electronically Signed   By: Burman Nieves M.D.   On: 09/06/2014 22:40    IMPRESSION/PLAN: 1.Pyelonephritis. U/A consistent with UTI. Pt currently on ceftriaxone. Urine culture pending.  2. Jaundice.  Pt has elevated transaminases and a neg hepatitis panel,neg HIV.Elevated globulins suggestive of possible autoimmune process. Will await ANA, anti smooth muscle antibody, mitochondrial  antibody--(all pending). Will  recheck CMP and PT/INR tomorrow morning. If still elevated may give trial of steroid and consider liver biopsy.   Hvozdovic, Tollie Pizza PA-C 09/07/2014,   GI ATTENDING  History, laboratories, x-rays reviewed. Patient personally seen and examined. Agree with H&P as outlined above. Patient presents herself to the hospital with jaundice. Fatigue, but otherwise well. She is found to have markedly abnormal liver tests with elevated globulins. No evidence for infectious hepatitis, alcohol related disease, or drug induced. No history of hypertension or evidence for vascular insult. Suspect autoimmune hepatitis. Await serologies. Hepatic synthetic function intact. We will follow.  Wilhemina Bonito. Eda Keys., M.D. [facility] Division of Gastroenterology

## 2014-09-07 NOTE — Progress Notes (Signed)
[facility] PROGRESS NOTE  IZABEL CHIM WUJ:[id number] DOB: 04/23/1961 DOA: 09/06/2014 PCP: No PCP Per Patient Interim summary: Rose Williams is a 53 y.o. female with PMH of kidney stone (p/s lithotripsy and stent placement), who presents with jaundice. CT-abdomen also showed possible severe pyelonephritis and ureteritis with left kidney hydronephrosis and multiple stones and cirrhosis and mild splenomegaly. She was started on rocephin for pyelonephritis and gastroenterology consulted for new cirrhosis. Of note her mom died of liver cancer of unknown etiology. Auto immune hepatitis work up has been sent including anti mitochondril antibodies, ana, anti smooth muscle antibodies and anti microsomal antibodies. Ferritin levels and serum copper levels ordered.   Assessment/Plan: 1. Pyelonephritis: Normal renal function. Hydronephrosis with renal stones, non obstructing. Call urology as needed. On rocephin for UTI and urine cultures pending. Hydration and anti emetics as needed.   Jaundice/ Liver cirrhosis and mild splenomegaly: Transaminases are improving. Hepatitis work up and auto immune work up pending. ? Lymphomatous changes. GI consulted to see if she needs liver biopsy.   Diet : clear liquid and advance as tolerated.    DVT prophylaxis.     Code Status: full code Family Communication: family and friends at bedside, discussed the plan of care with the patient.  Disposition Plan: pending further investigation   Consultants:  Gastroenterology.  Procedures:  CT abdomen and pelvis.   Antibiotics:  none  HPI/Subjective: Very apprehensive about the jaundice   Objective: Filed Vitals:   09/07/14 1357  BP: 130/81  Pulse: 98  Temp: 98.5 F (36.9 C)  Resp: 15    Intake/Output Summary (Last 24 hours) at 09/07/14 1736 Last data filed at 09/07/14 1429  Gross per 24 hour  Intake   1195 ml  Output   1750 ml  Net   -555 ml   Filed Weights   09/07/14 0040   Weight: 56.7 kg (125 lb)    Exam:   General:  Alert afebrile comfortable  Cardiovascular: s1s2  Respiratory: chest clear to auscultation, no wheezing or rhonchi  Abdomen: soft non tender non distended bowel sounds heard  Musculoskeletal: no pedal edema  Skin: no spider angiomata. No rash  Neuro: alert and oriented and no focal deficits.   Data Reviewed: Basic Metabolic Panel:  Recent Labs Lab 09/06/14 1603 09/07/14 0150  NA 137 135*  K 3.7 3.7  CL 101 101  CO2 24 23  GLUCOSE 108* 94  BUN 8 9  CREATININE 0.82 0.75  CALCIUM 9.1 8.3*   Liver Function Tests:  Recent Labs Lab 09/06/14 1603 09/07/14 0150  AST 1051* 824*  ALT 932* 735*  ALKPHOS 174* 141*  BILITOT 10.5* 9.2*  PROT 9.9* 8.3  ALBUMIN 3.5 2.8*    Recent Labs Lab 09/06/14 1603  LIPASE 31   No results found for this basename: AMMONIA,  in the last 168 hours CBC:  Recent Labs Lab 09/06/14 1603  WBC 4.3  NEUTROABS 2.9  HGB 13.1  HCT 36.6  MCV 89.9  PLT 245   Cardiac Enzymes: No results found for this basename: CKTOTAL, CKMB, CKMBINDEX, TROPONINI,  in the last 168 hours BNP (last 3 results) No results found for this basename: PROBNP,  in the last 8760 hours CBG: No results found for this basename: GLUCAP,  in the last 168 hours  No results found for this or any previous visit (from the past 240 hour(s)).   Studies: Ct Abdomen Pelvis W Contrast  09/06/2014   CLINICAL DATA:  Nausea. Hepatic dysfunction. Jaundice.  Pain and nausea after eating.  EXAM: CT ABDOMEN AND PELVIS WITH CONTRAST  TECHNIQUE: Multidetector CT imaging of the abdomen and pelvis was performed using the standard protocol following bolus administration of intravenous contrast.  CONTRAST:  OMNIPAQUE IOHEXOL 300 MG/ML  SOLN  COMPARISON:  11/24/2007  FINDINGS: Lung bases are clear.  Cirrhotic configuration of the liver with enlarged lateral segment of the left lobe and nodular contour. No focal liver lesions  demonstrated. Spleen is mildly enlarged. Small accessory spleen. The gallbladder is normal. No bile duct dilatation. Pancreas, adrenal glands, abdominal aorta, and inferior vena cava are unremarkable. The kidneys are distinctly abnormal. The right kidney is diffusely enlarged with abnormal nephrogram. The renal pelvis and ureter are not distended but there is prominent wall thickening throughout the ureter and of the pelvocaliceal system with infiltration of the renal sinus fat. This does not appear to represent a discrete mass. There are small cysts present. Appearance is most likely to represent severe pyelonephritis although infiltrating neoplasm or lymphoma is not excluded. The left kidney demonstrates hydronephrosis without hydroureter. Mild thickening of the ureteral wall and pyelocaliceal system. Diffuse parenchymal atrophy. 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 Calcifications demonstrated throughout the left kidney consistent with stones and seen on previous study. Prominent retroperitoneal lymph nodes mostly in the periaortic region and also in the celiac axis. Lymph nodes measure up to about 22 mm short axis dimension. These are nonspecific and could represent reactive or inflammatory nodes versus lymphoproliferative change or neoplasm. The stomach, small bowel, and colon are unremarkable. Contrast material flows through to the colon without evidence of bowel obstruction. No free air or free fluid in the abdomen.  Pelvis: Bladder wall is not thickened. Uterus and ovaries are not enlarged. Appendix is normal. No free or loculated pelvic fluid collections. No pelvic lymphadenopathy. No destructive bone lesions.  IMPRESSION: Kidneys and ureters are abnormal bilaterally. Right kidney demonstrates diffuse enlargement with abnormal nephrogram and with prominent wall thickening and infiltration around the right renal pelvis and ureter. Consider severe pyelonephritis and  ureteritis versus infiltrating neoplasm or lymphoma.  Left kidney demonstrates hydronephrosis with multiple stones, thickening of the urothelium, and parenchymal atrophy. Changes may represent pyelonephritis or reflux nephropathy. Changes of the left may represent a more chronic stage of similar changes on the right.  Changes of hepatic cirrhosis with mild splenic enlargement.  Nonspecific lymphadenopathy in the retroperitoneum and celiac axis.   Electronically Signed   By: Burman Nieves M.D.   On: 09/06/2014 22:40    Scheduled Meds: . antiseptic oral rinse  7 mL Mouth Rinse q12n4p  . cefTRIAXone (ROCEPHIN)  IV  1 g Intravenous QHS  . chlorhexidine  15 mL Mouth Rinse BID  . heparin  5,000 Units Subcutaneous 3 times per day   Continuous Infusions: . sodium chloride 100 mL/hr at 09/07/14 1127    Principal Problem:   Pyelonephritis Active Problems:   Calculus of kidney   ELEVATED BLOOD PRESSURE WITHOUT DIAGNOSIS OF HYPERTENSION   Jaundice   Hydronephrosis    Time spent: 25 minutes.     [facility]  [facility] Pager [phone] If 7PM-7AM, please contact night-coverage at [email or web address], password [email or web address] 09/07/2014, 5:36 PM  LOS: 1 day

## 2014-09-08 ENCOUNTER — Other Ambulatory Visit: Payer: Self-pay | Admitting: Physician Assistant

## 2014-09-08 ENCOUNTER — Telehealth: Payer: Self-pay

## 2014-09-08 ENCOUNTER — Other Ambulatory Visit: Payer: Self-pay | Admitting: Nurse Practitioner

## 2014-09-08 DIAGNOSIS — R7989 Other specified abnormal findings of blood chemistry: Secondary | ICD-10-CM

## 2014-09-08 DIAGNOSIS — R945 Abnormal results of liver function studies: Secondary | ICD-10-CM

## 2014-09-08 DIAGNOSIS — N12 Tubulo-interstitial nephritis, not specified as acute or chronic: Principal | ICD-10-CM

## 2014-09-08 LAB — URINE CULTURE
Colony Count: NO GROWTH
Culture: NO GROWTH

## 2014-09-08 LAB — IGG: IgG (Immunoglobin G), Serum: 3070 mg/dL — ABNORMAL HIGH (ref 690–1700)

## 2014-09-08 LAB — COMPREHENSIVE METABOLIC PANEL
ALT: 622 U/L — AB (ref 0–35)
AST: 763 U/L — AB (ref 0–37)
Albumin: 2.4 g/dL — ABNORMAL LOW (ref 3.5–5.2)
Alkaline Phosphatase: 120 U/L — ABNORMAL HIGH (ref 39–117)
Anion gap: 8 (ref 5–15)
BUN: 9 mg/dL (ref 6–23)
CO2: 23 meq/L (ref 19–32)
CREATININE: 0.76 mg/dL (ref 0.50–1.10)
Calcium: 7.9 mg/dL — ABNORMAL LOW (ref 8.4–10.5)
Chloride: 108 mEq/L (ref 96–112)
GFR calc Af Amer: >90 mL/min (ref >90)
Glucose, Bld: 88 mg/dL (ref 70–99)
Potassium: 3.8 mEq/L (ref 3.7–5.3)
SODIUM: 139 meq/L (ref 137–147)
TOTAL PROTEIN: 7.2 g/dL (ref 6.0–8.3)
Total Bilirubin: 8.4 mg/dL — ABNORMAL HIGH (ref 0.3–1.2)

## 2014-09-08 LAB — PROTIME-INR
INR: 1.27 (ref 0.00–1.49)
PROTHROMBIN TIME: 15.9 s — AB (ref 11.6–15.2)

## 2014-09-08 LAB — ANA: Anti Nuclear Antibody(ANA): NEGATIVE

## 2014-09-08 MED ORDER — PREDNISONE 20 MG PO TABS
40.0000 mg | ORAL_TABLET | Freq: Every day | ORAL | Status: DC
  Administered 2014-09-08: 40 mg via ORAL
  Filled 2014-09-08 (×2): qty 2

## 2014-09-08 MED ORDER — ONDANSETRON HCL 4 MG PO TABS: 4.0000 mg | ORAL_TABLET | Freq: Four times a day (QID) | ORAL | Status: DC | PRN

## 2014-09-08 MED ORDER — PREDNISONE 20 MG PO TABS: 40.0000 mg | ORAL_TABLET | Freq: Every day | ORAL | Status: DC

## 2014-09-08 NOTE — Telephone Encounter (Signed)
Pt scheduled to see Dr. Marina Goodell 09/22/14@2pm . Lawson Fiscal PA to notify pt of appt.

## 2014-09-08 NOTE — Discharge Instructions (Signed)
Please go to the [facility] at [facility] [employer] in 1 week to have blood work. The lab is located in the basement.  Jaundice Jaundice is a yellowish discoloration of the skin, whites of the eyes, and mucous membranes. It is caused by increased levels of bilirubin in the blood (hyperbilirubinemia). Bilirubin is produced by the normal breakdown of red blood cells. Jaundice may mean the liver or bile system is not working normally. CAUSES  The most common causes include:  Viral hepatitis.  Gallstones.  Excess use of alcohol.  Liver disease.  Certain cancers. SYMPTOMS   Yellow color to the skin, whites of the eyes, or mucous membranes.  Dark brown colored urine.  Stomach pain.  Light or clay colored stool.  Itchy skin. DIAGNOSIS   Your history will be taken along with a physical exam.  Urine and blood tests.  Abdominal ultrasound.  CT scans.  MRI.  Liver biopsy if the liver disease is suspected.  Endoscopic retrograde cholangiopancreatography (ERCP). TREATMENT  Treatment depends on the cause or related to the treatment of an underlying condition. For example, if jaundice is caused by gallstones, the stones or gallbladder may need to be removed. Other treatments may include:  Rest.  Stopping a certain medicine if it is causing the jaundice.  Giving fluid through the vein (IV fluids).  Surgery (removing gallstones, cancers). Some conditions that cause jaundice can be fatal if not treated. HOME CARE INSTRUCTIONS   Rest.  Drink enough fluids to keep your urine clear or pale yellow.  Avoid all alcoholic drinks.  Only take over-the-counter or prescription medicines for nausea, vomiting, itching, pain, discomfort, or fever as directed by your caregiver.  If jaundice is due to viral hepatitis or an infection:  Avoid close contact with people.  Avoid preparing food for others.  Avoid sharing utensils with others.  Wash your hands often.  Keep all  follow-up appointments with your caregiver.  Use skin lotions to relieve itching. SEEK IMMEDIATE MEDICAL CARE IF:   You have increased pain.  You have repeated vomiting.  You become dehydrated.  You have a fever or persistent symptoms for more than 72 hours.  You have a fever and your symptoms suddenly get worse.  You become weak or confused.  You develop a severe headache. MAKE SURE YOU:   Understand these instructions.  Will watch your condition.  Will get help right away if you are not doing well or get worse. Document Released: 12/17/2005 Document Revised: 03/10/2012 Document Reviewed: 12/01/2010 [facility] Patient Information 2015 [place], [place]. This information is not intended to replace advice given to you by your health care provider. Make sure you discuss any questions you have with your health care provider.

## 2014-09-08 NOTE — Telephone Encounter (Signed)
Message copied by Chrystie Nose on Wed Sep 08, 2014 10:38 AM ------      Message from: Karna Christmas D      Created: Wed Sep 08, 2014 10:23 AM       Lawson Fiscal (PA)  Wants PT worked in within 2 weeks w/Dr. Marina Goodell            [phone] Call Lori Back directly ------

## 2014-09-08 NOTE — Progress Notes (Signed)
[facility] Progress Note  Subjective:   Feels well. No N/V/D. No abd pain. No pruritis. Tol reg diet. On IV antibiotics for pyelonephritis.   Objective:  Vital signs in last 24 hours: Temp:  [98 F (36.7 C)-98.8 F (37.1 C)] 98.8 F (37.1 C) (09/09 0535) Pulse Rate:  [81-98] 81 (09/09 0535) Resp:  [15-16] 16 (09/09 0535) BP: (128-130)/(79-81) 128/79 mmHg (09/09 0535) SpO2:  [99 %-100 %] 100 % (09/09 0535) Weight:  [125 lb 10.6 oz (57 kg)] 125 lb 10.6 oz (57 kg) (09/09 0535) Last BM Date: 09/07/14 General:   Alert,  Well-developed, female in NAD Heart:  Regular rate and rhythm; no murmurs Pulm lungs clear Abdomen:  Soft, nontender and nondistended. Normal bowel sounds, without guarding, and without rebound.   Extremities:  Without edema. Neurologic:  Alert and  oriented x4;  grossly normal neurologically. Psych:  Alert and cooperative. Normal mood and affect.    Lab Results: IgG (Immunoglobin G), Serum 690 - 1700 mg/dL  1610 (H)      Recent Labs  09/06/14 1603  WBC 4.3  HGB 13.1  HCT 36.6  PLT 245   BMET  Recent Labs  09/06/14 1603 09/07/14 0150 09/08/14 0603  NA 137 135* 139  K 3.7 3.7 3.8  CL 101 101 108  CO2 GLUCOSE 108* 94 88  BUN CREATININE 0.82 0.75 0.76  CALCIUM 9.1 8.3* 7.9*   LFT  Recent Labs  09/07/14 0150 09/08/14 0603  PROT 8.3 7.2  ALBUMIN 2.8* 2.4*  AST 824* 763*  ALT 735* 622*  ALKPHOS 141* 120*  BILITOT 9.2* 8.4*  BILIDIR 6.8*  --    PT/INR  Recent Labs  09/07/14 0150 09/08/14 0603  LABPROT 15.7* 15.9*  INR 1.25 1.27   Hepatitis Panel  Recent Labs  09/07/14 0150  HEPBSAG NEGATIVE  HCVAB NEGATIVE  HEPAIGM NON REACTIVE  HEPBIGM NON REACTIVE    Ct Abdomen Pelvis W Contrast  09/06/2014   CLINICAL DATA:  Nausea. Hepatic dysfunction. Jaundice. Pain and nausea after eating.  EXAM: CT ABDOMEN AND PELVIS WITH CONTRAST  TECHNIQUE: Multidetector CT imaging of the abdomen and pelvis was  performed using the standard protocol following bolus administration of intravenous contrast.  CONTRAST:  OMNIPAQUE IOHEXOL 300 MG/ML  SOLN  COMPARISON:  11/24/2007  FINDINGS: Lung bases are clear.  Cirrhotic configuration of the liver with enlarged lateral segment of the left lobe and nodular contour. No focal liver lesions demonstrated. Spleen is mildly enlarged. Small accessory spleen. The gallbladder is normal. No bile duct dilatation. Pancreas, adrenal glands, abdominal aorta, and inferior vena cava are unremarkable. The kidneys are distinctly abnormal. The right kidney is diffusely enlarged with abnormal nephrogram. The renal pelvis and ureter are not distended but there is prominent wall thickening throughout the ureter and of the pelvocaliceal system with infiltration of the renal sinus fat. This does not appear to represent a discrete mass. There are small cysts present. Appearance is most likely to represent severe pyelonephritis although infiltrating neoplasm or lymphoma is not excluded. The left kidney demonstrates hydronephrosis without hydroureter. Mild thickening of the ureteral wall and pyelocaliceal system. Diffuse parenchymal atrophy. 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 Calcifications demonstrated throughout the left kidney consistent with stones and seen on previous study. Prominent retroperitoneal lymph nodes mostly in the periaortic region and also in the celiac axis. Lymph nodes measure up to about 22 mm short axis dimension.  These are nonspecific and could represent reactive or inflammatory nodes versus lymphoproliferative change or neoplasm. The stomach, small bowel, and colon are unremarkable. Contrast material flows through to the colon without evidence of bowel obstruction. No free air or free fluid in the abdomen.  Pelvis: Bladder wall is not thickened. Uterus and ovaries are not enlarged. Appendix is normal. No free or loculated pelvic  fluid collections. No pelvic lymphadenopathy. No destructive bone lesions.  IMPRESSION: Kidneys and ureters are abnormal bilaterally. Right kidney demonstrates diffuse enlargement with abnormal nephrogram and with prominent wall thickening and infiltration around the right renal pelvis and ureter. Consider severe pyelonephritis and ureteritis versus infiltrating neoplasm or lymphoma.  Left kidney demonstrates hydronephrosis with multiple stones, thickening of the urothelium, and parenchymal atrophy. Changes may represent pyelonephritis or reflux nephropathy. Changes of the left may represent a more chronic stage of similar changes on the right.  Changes of hepatic cirrhosis with mild splenic enlargement.  Nonspecific lymphadenopathy in the retroperitoneum and celiac axis.   Electronically Signed   By: Burman Nieves M.D.   On: 09/06/2014 22:40    Assessment / Plan: 1.Pyelonephritis. U/A consistent with UTI, urine culture no growth. Further treatment per medical service. 2. Jaundice. Pt has elevated transaminases and a neg hepatitis panel,neg HIV. IgG elevation suggestive of autoimmune hepatitis. Transaminases slightly improved. ANA pending. Will start on prednisone 40 mg po qg. Pt should have repeat LFTs and PT/INR in 1 week and follow up with Dr Marina Goodell in 2 weeks.     LOS: 2 days   Hvozdovic, Lori P PA-C 09/08/2014,   GI ATTENDING  Interval history and data reviewed. Patient personally seen and examined. Agree with H&P as above. Globulins elevated. ANA negative. Other autoimmune studies pending. We will start steroids. I discussed the side effects with her. Followup LFTs next week and office visit in 2 weeks. This has been arranged. Anticipate Imuran therapy as well. Okay for discharge today.  Wilhemina Bonito. Eda Keys., M.D. [facility] Division of Gastroenterology

## 2014-09-08 NOTE — Progress Notes (Signed)
Patient discharge teaching given, including activity, diet, follow-up appoints, and medications. Patient verbalized understanding of all discharge instructions. IV access was d/c'd. Vitals are stable. Skin is intact except as charted in most recent assessments. Pt to be escorted out by NT, to be driven home by family. 

## 2014-09-08 NOTE — Discharge Summary (Signed)
Physician Discharge Summary  Rose Williams ZOX:[id number] DOB: 06/24/1961 DOA: 09/06/2014  PCP: No PCP yet, pt in process of setting up   Admit date: 09/06/2014 Discharge date: 09/08/2014  Recommendations for Outpatient Follow-up:  1. Pt will need to follow up with PCP in 2-3 weeks post discharge 2. Please obtain BMP to evaluate electrolytes and kidney function 3. Please also check CBC to evaluate Hg and Hct levels 4. Please note, case d/w with urologist, since pt is asymptomatic with no fevers and no leukocytosis, urine culture negative, no ABX recommended 5. Pt made aware no ABX needed as she was asymptomatic, symptoms of N/V/fevers discussed and pt advised to call me back if these symptoms occur so that we can treat appropriately 6. Pt also advised she will need repeat CT abd/pelvis in 6 months to ensure resolution  7. I gave patient my number where she can reach me with any questions until she is able to find PCP  Discharge Diagnoses: Jaundice  Principal Problem:   Pyelonephritis Active Problems:   Calculus of kidney   ELEVATED BLOOD PRESSURE WITHOUT DIAGNOSIS OF HYPERTENSION   Jaundice   Hydronephrosis  Discharge Condition: Stable  Diet recommendation: Heart healthy diet discussed in details   History of present illness:  53 y.o. female with PMH of kidney stone (p/s lithotripsy and stent placement), who presented with jaundice. CT-abdomen also showed possible severe pyelonephritis and ureteritis with left kidney hydronephrosis and multiple stones and cirrhosis and mild splenomegaly. She was started on rocephin for pyelonephritis and gastroenterology consulted for new cirrhosis. Of note her mom died of liver cancer of unknown etiology. Auto immune hepatitis work up has been sent including anti mitochondril antibodies, ana, anti smooth muscle antibodies and anti microsomal antibodies. Ferritin levels and serum copper levels ordered.   Hospital Course:  Principal Problem:  Jaundice - work up in process - pt feeling better this AM and bili is trending down  - pt started on Prednisone per GI and will follow up in their office  - will need to follow up on pending blood work    Pyelonephritis - pt entirely asymptomatic with no fever and no N/V, no pain - will hold off on ABX as recommended per GU specialist with need for Ct abd follow up in 6 months  Active Problems:   Calculus of kidney - no need for acute intervention at this time - d/w pt      Procedures/Studies: Ct Abdomen Pelvis W Contrast  09/06/2014    Kidneys and ureters are abnormal bilaterally. Right kidney demonstrates diffuse enlargement with abnormal nephrogram and with prominent wall thickening and infiltration around the right renal pelvis and ureter. Consider severe pyelonephritis and ureteritis versus infiltrating neoplasm or lymphoma.  Left kidney demonstrates hydronephrosis with multiple stones, thickening of the urothelium, and parenchymal atrophy. Changes may represent pyelonephritis or reflux nephropathy. Changes of the left may represent a more chronic stage of similar changes on the right.  Changes of hepatic cirrhosis with mild splenic enlargement.  Nonspecific lymphadenopathy in the retroperitoneum and celiac axis.    Consultations:  GI   Discharge Exam: Filed Vitals:   09/08/14 0535  BP: 128/79  Pulse: 81  Temp: 98.8 F (37.1 C)  Resp: 16   Filed Vitals:   09/07/14 0615 09/07/14 1357 09/07/14 2147 09/08/14 0535  BP: 130/68 130/81 128/79 128/79  Pulse: 99 98 86 81  Temp: 98 F (36.7 C) 98.5 F (36.9 C) 98 F (36.7 C) 98.8 F (37.1 C)  TempSrc: Oral Oral Oral Oral  Resp:  Height:      Weight:    57 kg (125 lb 10.6 oz)  SpO2: 100% 100% 99% 100%    General: Pt is alert, follows commands appropriately, not in acute distress Cardiovascular: Regular rate and rhythm, S1/S2 +, no murmurs, no rubs, no gallops Respiratory: Clear to auscultation bilaterally, no  wheezing, no crackles, no rhonchi Abdominal: Soft, non tender, non distended, bowel sounds +, no guarding Extremities: no edema, no cyanosis, pulses palpable bilaterally DP and PT Neuro: Grossly nonfocal  Discharge Instructions  Discharge Instructions   Diet - low sodium heart healthy    Complete by:  As directed      Increase activity slowly    Complete by:  As directed             Medication List         ondansetron 4 MG tablet  Commonly known as:  ZOFRAN  Take 1 tablet (4 mg total) by mouth every 6 (six) hours as needed for nausea.     predniSONE 20 MG tablet  Commonly known as:  DELTASONE  Take 2 tablets (40 mg total) by mouth daily with breakfast.           Follow-up Information   Follow up with Yancey Flemings, MD On 09/22/2014. (appt time 2:00--be there 15 minutes eerly.)    Specialty:  Gastroenterology   Contact information:   [address] [place] [address] [phone]       Follow up with Debbora Presto, MD. (As needed call my cell phone [phone])    Specialty:  Internal Medicine   Contact information:   [address] [place] [address] [phone]        The results of significant diagnostics from this hospitalization (including imaging, microbiology, ancillary and laboratory) are listed below for reference.     Microbiology: Recent Results (from the past 240 hour(s))  URINE CULTURE     Status: None   Collection Time    09/07/14 12:30 AM      Result Value Ref Range Status   Specimen Description URINE, CLEAN CATCH   Final   Special Requests NONE   Final   Culture  Setup Time     Final   Value: 09/07/2014 10:05     Performed at [facility] Count     Final   Value: NO GROWTH     Performed at [facility]   Culture     Final   Value: NO GROWTH     Performed at [facility]   Report Status 09/08/2014 FINAL   Final     Labs: Basic Metabolic Panel:  Recent Labs Lab  09/06/14 1603 09/07/14 0150 09/08/14 0603  NA 137 135* 139  K 3.7 3.7 3.8  CL 101 101 108  CO2 GLUCOSE 108* 94 88  BUN CREATININE 0.82 0.75 0.76  CALCIUM 9.1 8.3* 7.9*   Liver Function Tests:  Recent Labs Lab 09/06/14 1603 09/07/14 0150 09/08/14 0603  AST 1051* 824* 763*  ALT 932* 735* 622*  ALKPHOS 174* 141* 120*  BILITOT 10.5* 9.2* 8.4*  PROT 9.9* 8.3 7.2  ALBUMIN 3.5 2.8* 2.4*    Recent Labs Lab 09/06/14 1603  LIPASE 31   CBC:  Recent Labs Lab 09/06/14 1603  WBC 4.3  NEUTROABS 2.9  HGB 13.1  HCT 36.6  MCV 89.9  PLT 245   SIGNED: Time coordinating discharge: Over 30 minutes  Debbora Presto, MD  [facility] 09/08/2014, 12:41 PM Pager [phone]  If 7PM-7AM, please contact night-coverage [email or web address] Password [email or web address]

## 2014-09-09 LAB — ANTI-SMOOTH MUSCLE ANTIBODY, IGG: F-Actin IgG: 169 U — ABNORMAL HIGH (ref <20)

## 2014-09-10 LAB — ANTI-MICROSOMAL ANTIBODY LIVER / KIDNEY

## 2014-09-10 LAB — MITOCHONDRIAL ANTIBODIES: Mitochondrial M2 Ab, IgG: 0.87 (ref <0.91)

## 2014-09-15 ENCOUNTER — Other Ambulatory Visit (INDEPENDENT_AMBULATORY_CARE_PROVIDER_SITE_OTHER): Payer: 59

## 2014-09-15 DIAGNOSIS — R945 Abnormal results of liver function studies: Secondary | ICD-10-CM

## 2014-09-15 DIAGNOSIS — R7989 Other specified abnormal findings of blood chemistry: Secondary | ICD-10-CM

## 2014-09-15 LAB — COMPREHENSIVE METABOLIC PANEL
ALK PHOS: 151 U/L — AB (ref 39–117)
ALT: 594 U/L — AB (ref 0–35)
AST: 350 U/L — ABNORMAL HIGH (ref 0–37)
Albumin: 3.7 g/dL (ref 3.5–5.2)
BILIRUBIN TOTAL: 3.7 mg/dL — AB (ref 0.2–1.2)
BUN: 20 mg/dL (ref 6–23)
CO2: 27 mEq/L (ref 19–32)
Calcium: 9 mg/dL (ref 8.4–10.5)
Chloride: 100 mEq/L (ref 96–112)
Creatinine, Ser: 0.9 mg/dL (ref 0.4–1.2)
GFR: 69.01 mL/min (ref >60.00)
GLUCOSE: 88 mg/dL (ref 70–99)
POTASSIUM: 3.3 meq/L — AB (ref 3.5–5.1)
Sodium: 133 mEq/L — ABNORMAL LOW (ref 135–145)
Total Protein: 8.9 g/dL — ABNORMAL HIGH (ref 6.0–8.3)

## 2014-09-15 LAB — PROTIME-INR
INR: 1 ratio (ref 0.8–1.0)
PROTHROMBIN TIME: 11 s (ref 9.6–13.1)

## 2014-09-17 ENCOUNTER — Other Ambulatory Visit: Payer: Self-pay

## 2014-09-17 DIAGNOSIS — R7989 Other specified abnormal findings of blood chemistry: Secondary | ICD-10-CM

## 2014-09-17 DIAGNOSIS — R945 Abnormal results of liver function studies: Principal | ICD-10-CM

## 2014-09-22 ENCOUNTER — Ambulatory Visit (INDEPENDENT_AMBULATORY_CARE_PROVIDER_SITE_OTHER): Payer: 59 | Admitting: Internal Medicine

## 2014-09-22 ENCOUNTER — Other Ambulatory Visit (INDEPENDENT_AMBULATORY_CARE_PROVIDER_SITE_OTHER): Payer: 59

## 2014-09-22 ENCOUNTER — Encounter: Payer: Self-pay | Admitting: Internal Medicine

## 2014-09-22 VITALS — BP 152/96 | HR 104 | Ht 66.0 in | Wt 126.2 lb

## 2014-09-22 DIAGNOSIS — R945 Abnormal results of liver function studies: Principal | ICD-10-CM

## 2014-09-22 DIAGNOSIS — K754 Autoimmune hepatitis: Secondary | ICD-10-CM

## 2014-09-22 DIAGNOSIS — R7989 Other specified abnormal findings of blood chemistry: Secondary | ICD-10-CM

## 2014-09-22 DIAGNOSIS — K7460 Unspecified cirrhosis of liver: Secondary | ICD-10-CM

## 2014-09-22 LAB — COMPREHENSIVE METABOLIC PANEL
ALK PHOS: 128 U/L — AB (ref 39–117)
ALT: 445 U/L — AB (ref 0–35)
AST: 189 U/L — ABNORMAL HIGH (ref 0–37)
Albumin: 4 g/dL (ref 3.5–5.2)
BILIRUBIN TOTAL: 3 mg/dL — AB (ref 0.2–1.2)
BUN: 19 mg/dL (ref 6–23)
CO2: 29 meq/L (ref 19–32)
CREATININE: 1 mg/dL (ref 0.4–1.2)
Calcium: 9 mg/dL (ref 8.4–10.5)
Chloride: 98 mEq/L (ref 96–112)
GFR: 65.67 mL/min (ref >60.00)
GLUCOSE: 112 mg/dL — AB (ref 70–99)
Potassium: 3.8 mEq/L (ref 3.5–5.1)
Sodium: 132 mEq/L — ABNORMAL LOW (ref 135–145)
Total Protein: 8.9 g/dL — ABNORMAL HIGH (ref 6.0–8.3)

## 2014-09-22 LAB — HEPATIC FUNCTION PANEL
ALT: 413 U/L — ABNORMAL HIGH (ref 0–35)
AST: 195 U/L — AB (ref 0–37)
Albumin: 3.9 g/dL (ref 3.5–5.2)
Alkaline Phosphatase: 129 U/L — ABNORMAL HIGH (ref 39–117)
BILIRUBIN TOTAL: 2.7 mg/dL — AB (ref 0.2–1.2)
Bilirubin, Direct: 1.2 mg/dL — ABNORMAL HIGH (ref 0.0–0.3)
Total Protein: 9 g/dL — ABNORMAL HIGH (ref 6.0–8.3)

## 2014-09-22 NOTE — Progress Notes (Signed)
HISTORY OF PRESENT ILLNESS:  Rose Williams is a 53 y.o. female who was seen for the first time, as a hospital inpatient, approximately 2 weeks ago regarding new onset jaundice and pyelonephritis. At that time her transaminases were in the 700-800 range with alkaline phosphatase 141 and total bilirubin 9.2. Acute hepatitis serologies were negative. The patient does not use alcohol. No medications or over-the-counter agents. No suspicion of vascular insult. She did undergo contrast-enhanced CT scan of the abdomen and pelvis which in addition to neurologic abnormalities, revealed changes of hepatic cirrhosis with mild splenic enlargement. Her globulins were elevated. Autoimmune studies were positive for elevated anti-smooth muscle antibody. She was started on prednisone 40 mg daily. No liver biopsy elected. Laboratories last week showed improvement in all parameters significantly. She presents today for followup. She is accompanied by her husband. She reports feeling well. No complaints. Tolerating prednisone without issues.  REVIEW OF SYSTEMS:  All non-GI ROS negative upon review  Past Medical History  Diagnosis Date  . Kidney stones   . Pyelonephritis   . Hydronephrosis   . Jaundice     Past Surgical History  Procedure Laterality Date  . Lithotripsy      2008    Social History [name]  reports that she has never smoked. She has never used smokeless tobacco. She reports that she does not drink alcohol or use illicit drugs.  family history includes Cancer in her mother; Cystic fibrosis in her child; Kidney Stones in an other family member.  Allergies  Allergen Reactions  . Other Itching    Itching from melons, cantaloupe, eggplant  . Pseudoephedrine Other (See Comments)    tachycardia       PHYSICAL EXAMINATION: Vital signs: BP 152/96  Pulse 104  Ht  (1.676 m)  Wt 126 lb 3.2 oz (57.244 kg)  BMI 20.38 kg/m2 General: Well-developed, well-nourished, no acute  distress HEENT: Sclerae are anicteric, conjunctiva pink. Oral mucosa intact Lungs: Clear Heart: Regular Abdomen: soft, nontender, nondistended, no obvious ascites, no peritoneal signs, normal bowel sounds. No organomegaly. Extremities: No edema Psychiatric: alert and oriented x3. Cooperative     ASSESSMENT:  #1. Autoimmune hepatitis. Evidence for hepatic cirrhosis and splenomegaly on CT suggests chronic subclinical disease prior to presentation. Responding to prednisone therapy. No evidence for decompensated liver disease.   PLAN:  #1. Extensive discussion today with the patient and her husband on autoimmune hepatitis, early cirrhosis, treatment strategy and rationale, and long-term outcomes. #2. Repeat liver tests today. Returned and improved. #3. TPMT. genotype anticipating initiation of azathioprine. If genotype normal would start azathioprine 100 mg daily based on body weight. Discussed #4. Check hepatitis A and hepatitis B immune status. She will need vaccinated if she has no immunity. #5. Continue to check liver tests every 2 weeks. Taper prednisone as allowed. #6. Routine office followup in 6 weeks. Sooner if needed. Contact the office for any questions or problems.

## 2014-09-22 NOTE — Patient Instructions (Signed)
Your physician has requested that you go to the basement for the following lab work before leaving today:  TPMT  Please come to have a CBC drawn every 2 weeks starting 2 weeks from today.    Please follow up with Dr. Marina Goodell on 11/09/2014 at 1:30pm

## 2014-09-23 ENCOUNTER — Other Ambulatory Visit: Payer: Self-pay | Admitting: *Deleted

## 2014-09-23 DIAGNOSIS — K754 Autoimmune hepatitis: Secondary | ICD-10-CM

## 2014-09-24 ENCOUNTER — Telehealth: Payer: Self-pay

## 2014-09-24 DIAGNOSIS — K754 Autoimmune hepatitis: Secondary | ICD-10-CM

## 2014-09-24 NOTE — Telephone Encounter (Signed)
Message copied by Annett Fabian on Fri Sep 24, 2014 11:00 AM ------      Message from: Hilarie Fredrickson      Created: Wed Sep 22, 2014  6:09 PM      Regarding: Meredith Staggers,      The patient needs hepatitis A IgG antibody and hepatitis B surface antibody with her next blood draw. Please add ------

## 2014-09-29 ENCOUNTER — Other Ambulatory Visit: Payer: Self-pay

## 2014-09-29 LAB — THIOPURINE METHYLTRANSFERASE (TPMT), RBC: THIOPURINE METHYLTRANSFERASE, RBC: 19 (ref >12)

## 2014-09-29 MED ORDER — AZATHIOPRINE 100 MG PO TABS: 100.0000 mg | ORAL_TABLET | Freq: Every day | ORAL | Status: DC

## 2014-10-06 ENCOUNTER — Other Ambulatory Visit (INDEPENDENT_AMBULATORY_CARE_PROVIDER_SITE_OTHER): Payer: 59

## 2014-10-06 DIAGNOSIS — K754 Autoimmune hepatitis: Secondary | ICD-10-CM

## 2014-10-06 LAB — COMPREHENSIVE METABOLIC PANEL
ALT: 158 U/L — ABNORMAL HIGH (ref 0–35)
AST: 71 U/L — ABNORMAL HIGH (ref 0–37)
Albumin: 3.4 g/dL — ABNORMAL LOW (ref 3.5–5.2)
Alkaline Phosphatase: 97 U/L (ref 39–117)
BILIRUBIN TOTAL: 1.3 mg/dL — AB (ref 0.2–1.2)
BUN: 21 mg/dL (ref 6–23)
CALCIUM: 9.6 mg/dL (ref 8.4–10.5)
CO2: 23 meq/L (ref 19–32)
CREATININE: 1 mg/dL (ref 0.4–1.2)
Chloride: 101 mEq/L (ref 96–112)
GFR: 64.1 mL/min (ref >60.00)
Glucose, Bld: 155 mg/dL — ABNORMAL HIGH (ref 70–99)
Potassium: 3.5 mEq/L (ref 3.5–5.1)
Sodium: 136 mEq/L (ref 135–145)
Total Protein: 7.8 g/dL (ref 6.0–8.3)

## 2014-10-07 ENCOUNTER — Other Ambulatory Visit: Payer: Self-pay | Admitting: Internal Medicine

## 2014-10-07 ENCOUNTER — Other Ambulatory Visit: Payer: Self-pay

## 2014-10-07 DIAGNOSIS — R7989 Other specified abnormal findings of blood chemistry: Secondary | ICD-10-CM

## 2014-10-07 DIAGNOSIS — R945 Abnormal results of liver function studies: Principal | ICD-10-CM

## 2014-10-07 LAB — HEPATITIS B SURFACE ANTIBODY,QUALITATIVE: Hep B S Ab: NEGATIVE

## 2014-10-07 LAB — HEPATITIS A ANTIBODY, IGM: HEP A IGM: NONREACTIVE

## 2014-10-08 ENCOUNTER — Telehealth: Payer: Self-pay | Admitting: Internal Medicine

## 2014-10-08 MED ORDER — PREDNISONE 20 MG PO TABS: 30.0000 mg | ORAL_TABLET | Freq: Every day | ORAL | Status: DC

## 2014-10-08 NOTE — Telephone Encounter (Signed)
Refilled Prednisone per patient's request

## 2014-10-13 ENCOUNTER — Other Ambulatory Visit (INDEPENDENT_AMBULATORY_CARE_PROVIDER_SITE_OTHER): Payer: 59

## 2014-10-13 DIAGNOSIS — K754 Autoimmune hepatitis: Secondary | ICD-10-CM

## 2014-10-13 DIAGNOSIS — R7989 Other specified abnormal findings of blood chemistry: Secondary | ICD-10-CM

## 2014-10-13 DIAGNOSIS — R945 Abnormal results of liver function studies: Secondary | ICD-10-CM

## 2014-10-13 LAB — COMPREHENSIVE METABOLIC PANEL
ALBUMIN: 3.5 g/dL (ref 3.5–5.2)
ALK PHOS: 108 U/L (ref 39–117)
ALT: 116 U/L — ABNORMAL HIGH (ref 0–35)
AST: 50 U/L — AB (ref 0–37)
BILIRUBIN TOTAL: 1 mg/dL (ref 0.2–1.2)
BUN: 21 mg/dL (ref 6–23)
CO2: 26 mEq/L (ref 19–32)
Calcium: 9.1 mg/dL (ref 8.4–10.5)
Chloride: 101 mEq/L (ref 96–112)
Creatinine, Ser: 1.1 mg/dL (ref 0.4–1.2)
GFR: 55.43 mL/min — ABNORMAL LOW (ref >60.00)
Glucose, Bld: 87 mg/dL (ref 70–99)
POTASSIUM: 3.6 meq/L (ref 3.5–5.1)
Sodium: 136 mEq/L (ref 135–145)
Total Protein: 7.9 g/dL (ref 6.0–8.3)

## 2014-10-13 LAB — HEPATIC FUNCTION PANEL
ALK PHOS: 108 U/L (ref 39–117)
ALT: 116 U/L — AB (ref 0–35)
AST: 50 U/L — AB (ref 0–37)
Albumin: 3.5 g/dL (ref 3.5–5.2)
BILIRUBIN DIRECT: 0.4 mg/dL — AB (ref 0.0–0.3)
TOTAL PROTEIN: 7.9 g/dL (ref 6.0–8.3)
Total Bilirubin: 1 mg/dL (ref 0.2–1.2)

## 2014-10-14 ENCOUNTER — Other Ambulatory Visit: Payer: Self-pay

## 2014-10-14 DIAGNOSIS — R7989 Other specified abnormal findings of blood chemistry: Secondary | ICD-10-CM

## 2014-10-14 DIAGNOSIS — K754 Autoimmune hepatitis: Secondary | ICD-10-CM

## 2014-10-14 DIAGNOSIS — R945 Abnormal results of liver function studies: Principal | ICD-10-CM

## 2014-10-22 ENCOUNTER — Other Ambulatory Visit (INDEPENDENT_AMBULATORY_CARE_PROVIDER_SITE_OTHER): Payer: 59

## 2014-10-22 ENCOUNTER — Other Ambulatory Visit: Payer: Self-pay

## 2014-10-22 DIAGNOSIS — R7989 Other specified abnormal findings of blood chemistry: Secondary | ICD-10-CM

## 2014-10-22 DIAGNOSIS — R945 Abnormal results of liver function studies: Principal | ICD-10-CM

## 2014-10-22 LAB — HEPATIC FUNCTION PANEL
ALBUMIN: 3.3 g/dL — AB (ref 3.5–5.2)
ALT: 65 U/L — ABNORMAL HIGH (ref 0–35)
AST: 36 U/L (ref 0–37)
Alkaline Phosphatase: 107 U/L (ref 39–117)
BILIRUBIN TOTAL: 0.7 mg/dL (ref 0.2–1.2)
Bilirubin, Direct: 0.3 mg/dL (ref 0.0–0.3)
Total Protein: 7.3 g/dL (ref 6.0–8.3)

## 2014-10-29 ENCOUNTER — Other Ambulatory Visit (INDEPENDENT_AMBULATORY_CARE_PROVIDER_SITE_OTHER): Payer: 59

## 2014-10-29 DIAGNOSIS — R7989 Other specified abnormal findings of blood chemistry: Secondary | ICD-10-CM

## 2014-10-29 DIAGNOSIS — K754 Autoimmune hepatitis: Secondary | ICD-10-CM

## 2014-10-29 DIAGNOSIS — R945 Abnormal results of liver function studies: Secondary | ICD-10-CM

## 2014-10-29 LAB — HEPATIC FUNCTION PANEL
ALT: 62 U/L — ABNORMAL HIGH (ref 0–35)
AST: 37 U/L (ref 0–37)
Albumin: 3.5 g/dL (ref 3.5–5.2)
Alkaline Phosphatase: 82 U/L (ref 39–117)
BILIRUBIN TOTAL: 1 mg/dL (ref 0.2–1.2)
Bilirubin, Direct: 0.3 mg/dL (ref 0.0–0.3)
Total Protein: 7.3 g/dL (ref 6.0–8.3)

## 2014-10-29 LAB — COMPREHENSIVE METABOLIC PANEL
ALBUMIN: 3.5 g/dL (ref 3.5–5.2)
ALT: 62 U/L — AB (ref 0–35)
AST: 37 U/L (ref 0–37)
Alkaline Phosphatase: 82 U/L (ref 39–117)
BILIRUBIN TOTAL: 1 mg/dL (ref 0.2–1.2)
BUN: 13 mg/dL (ref 6–23)
CHLORIDE: 100 meq/L (ref 96–112)
CO2: 31 mEq/L (ref 19–32)
Calcium: 9.3 mg/dL (ref 8.4–10.5)
Creatinine, Ser: 1 mg/dL (ref 0.4–1.2)
GFR: 62.59 mL/min (ref >60.00)
GLUCOSE: 102 mg/dL — AB (ref 70–99)
Potassium: 4.4 mEq/L (ref 3.5–5.1)
SODIUM: 136 meq/L (ref 135–145)
TOTAL PROTEIN: 7.3 g/dL (ref 6.0–8.3)

## 2014-11-01 ENCOUNTER — Other Ambulatory Visit: Payer: Self-pay

## 2014-11-01 DIAGNOSIS — R7989 Other specified abnormal findings of blood chemistry: Secondary | ICD-10-CM

## 2014-11-01 DIAGNOSIS — R945 Abnormal results of liver function studies: Principal | ICD-10-CM

## 2014-11-05 ENCOUNTER — Other Ambulatory Visit: Payer: Self-pay

## 2014-11-05 ENCOUNTER — Other Ambulatory Visit (INDEPENDENT_AMBULATORY_CARE_PROVIDER_SITE_OTHER): Payer: 59

## 2014-11-05 DIAGNOSIS — K754 Autoimmune hepatitis: Secondary | ICD-10-CM

## 2014-11-05 DIAGNOSIS — R7989 Other specified abnormal findings of blood chemistry: Secondary | ICD-10-CM

## 2014-11-05 DIAGNOSIS — R945 Abnormal results of liver function studies: Principal | ICD-10-CM

## 2014-11-05 LAB — COMPREHENSIVE METABOLIC PANEL
ALBUMIN: 3.5 g/dL (ref 3.5–5.2)
ALK PHOS: 72 U/L (ref 39–117)
ALT: 58 U/L — ABNORMAL HIGH (ref 0–35)
AST: 37 U/L (ref 0–37)
BUN: 17 mg/dL (ref 6–23)
CO2: 30 meq/L (ref 19–32)
Calcium: 9.4 mg/dL (ref 8.4–10.5)
Chloride: 105 mEq/L (ref 96–112)
Creatinine, Ser: 0.9 mg/dL (ref 0.4–1.2)
GFR: 68.97 mL/min (ref >60.00)
Glucose, Bld: 93 mg/dL (ref 70–99)
POTASSIUM: 4.1 meq/L (ref 3.5–5.1)
SODIUM: 141 meq/L (ref 135–145)
TOTAL PROTEIN: 7.6 g/dL (ref 6.0–8.3)
Total Bilirubin: 1 mg/dL (ref 0.2–1.2)

## 2014-11-05 LAB — HEPATIC FUNCTION PANEL
ALBUMIN: 3.5 g/dL (ref 3.5–5.2)
ALT: 58 U/L — ABNORMAL HIGH (ref 0–35)
AST: 37 U/L (ref 0–37)
Alkaline Phosphatase: 72 U/L (ref 39–117)
Bilirubin, Direct: 0.3 mg/dL (ref 0.0–0.3)
Total Bilirubin: 1 mg/dL (ref 0.2–1.2)
Total Protein: 7.6 g/dL (ref 6.0–8.3)

## 2014-11-09 ENCOUNTER — Encounter: Payer: Self-pay | Admitting: Internal Medicine

## 2014-11-09 ENCOUNTER — Ambulatory Visit (INDEPENDENT_AMBULATORY_CARE_PROVIDER_SITE_OTHER): Payer: 59 | Admitting: Internal Medicine

## 2014-11-09 VITALS — BP 160/84 | HR 96 | Ht 66.0 in | Wt 133.5 lb

## 2014-11-09 DIAGNOSIS — K754 Autoimmune hepatitis: Secondary | ICD-10-CM

## 2014-11-09 NOTE — Progress Notes (Signed)
HISTORY OF PRESENT ILLNESS:  Rose Williams is a 53 y.o. female who was seen for the first time, as a hospital inpatient, in early September regarding new onset jaundice and pyelonephritis. At that time her transaminases were in the 700-800 range with alkaline phosphatase 141 and total bilirubin 9.2. Acute hepatitis serologies were negative. The patient does not use alcohol. She was on no medications or over-the-counter agents. No suspicion of vascular insult. She did undergo contrast-enhanced CT scan of the abdomen and pelvis which in addition to urologic abnormalities, revealed changes of hepatic cirrhosis with mild splenic enlargement. Her globulins were elevated. Autoimmune studies were positive for elevated anti-smooth muscle antibody at 169 (greater than 30 strongly positive). Other autoimmune studies were negative. She was started on prednisone 40 mg daily. No liver biopsy elected. Laboratories within 1 week showed improvement in all parameters significantly. She presented on 09/22/2014 for followup. She was accompanied by her husband. She reported feeling well. No complaints. Tolerating prednisone without issues. Since that time she has had close monitoring of her liver tests with ongoing improvement. In addition, gradual prednisone taper. Her liver tests from 4 days ago revealed mild elevation of ALT 58. Other liver tests were normal including AST of 37, alkaline phosphatase 72, total bilirubin 1.0. Total protein was 7.6 with albumin 3.5. TPMT genetics were previously obtained. She is a normal metabolizer. I recommended Imuran 100 mg daily. However, she elected not to initiate therapy (without sharing that information until today). She is currently on 10 mg of prednisone daily. She is again accompanied by her husband. She has developed cushingoid facies. No other complaints. Being a Research scientist (physical sciences)healthcare worker, she is concerned about immune suppression therapy.   REVIEW OF SYSTEMS:  All non-GI ROS negative  upon comprehensive review   Past Medical History  Diagnosis Date  . Kidney stones   . Pyelonephritis   . Hydronephrosis   . Jaundice   . Cirrhosis   . Autoimmune hepatitis     Past Surgical History  Procedure Laterality Date  . Lithotripsy      2008    Social History Rose Williams  reports that she has never smoked. She has never used smokeless tobacco. She reports that she does not drink alcohol or use illicit drugs.  family history includes Cancer in her mother; Cystic fibrosis in her child; Kidney Stones in an other family member.  Allergies  Allergen Reactions  . Dairy Aid [Lactase] Diarrhea and Nausea And Vomiting    Allergic to all dairy products  . Gluten Meal Diarrhea and Nausea And Vomiting  . Other Itching    Itching from melons, cantaloupe, eggplant  . Pseudoephedrine Other (See Comments)    tachycardia  . Whey Protein [Protein] Diarrhea and Nausea And Vomiting       PHYSICAL EXAMINATION: Vital signs: BP 160/84 mmHg  Pulse 96  Ht 5\' 6"  (1.676 m)  Wt 133 lb 8 oz (60.555 kg)  BMI 21.56 kg/m2  Constitutional: generally well-appearing, no acute distress. Cushingoid facies Psychiatric: alert and oriented x3, cooperative Eyes: extraocular movements intact, anicteric, conjunctiva pink Mouth: oral pharynx moist, no lesions Neck: supple no lymphadenopathy Cardiovascular: heart regular rate and rhythm, no murmur Lungs: clear to auscultation bilaterally Abdomen: soft, nontender, nondistended, no obvious ascites, no peritoneal signs, normal bowel sounds, no organomegaly Extremities: no lower extremity edema bilaterally Skin: no lesions on visible extremities Neuro: No focal deficits. No asterixis.   ASSESSMENT:  #1. Autoimmune hepatitis. Imaging studies suggest an element of underlying cirrhosis.  Near normalization of LFTs on prednisone. Currently 10 mg daily.The patient wonders about other diagnoses or the need for Imuran moving  forward.   PLAN:  #1. I discussed with the patient the rationale of Imuran therapy. This, particularly given the underlying suspicion of cirrhosis. We did not perform liver biopsy. I did recommend that she consider a second opinion from a tertiary specialist. I recommended Dr. Esmond HarpsAndrew Muir, [name]. She was appreciative, but not necessarily interested in pursuing that option. I did tell her that I would be happy to touch Dr. Corky SingMuir or to see if he would recommend the same or something different. She was agreeable. In the interim, she will continue on her current dose of prednisone and periodic liver tests as directed.

## 2014-11-09 NOTE — Patient Instructions (Signed)
Please follow up with Dr. Marina GoodellPerry on 01/07/2015 at 2:15pm

## 2014-11-22 ENCOUNTER — Other Ambulatory Visit (INDEPENDENT_AMBULATORY_CARE_PROVIDER_SITE_OTHER): Payer: 59

## 2014-11-22 DIAGNOSIS — R945 Abnormal results of liver function studies: Secondary | ICD-10-CM

## 2014-11-22 DIAGNOSIS — K754 Autoimmune hepatitis: Secondary | ICD-10-CM

## 2014-11-22 DIAGNOSIS — R7989 Other specified abnormal findings of blood chemistry: Secondary | ICD-10-CM

## 2014-11-23 ENCOUNTER — Other Ambulatory Visit: Payer: Self-pay

## 2014-11-23 ENCOUNTER — Telehealth: Payer: Self-pay

## 2014-11-23 DIAGNOSIS — R945 Abnormal results of liver function studies: Principal | ICD-10-CM

## 2014-11-23 DIAGNOSIS — R7989 Other specified abnormal findings of blood chemistry: Secondary | ICD-10-CM

## 2014-11-23 LAB — COMPREHENSIVE METABOLIC PANEL
ALT: 36 U/L — ABNORMAL HIGH (ref 0–35)
AST: 29 U/L (ref 0–37)
Albumin: 3.9 g/dL (ref 3.5–5.2)
Alkaline Phosphatase: 67 U/L (ref 39–117)
BUN: 17 mg/dL (ref 6–23)
CALCIUM: 9.1 mg/dL (ref 8.4–10.5)
CHLORIDE: 102 meq/L (ref 96–112)
CO2: 27 mEq/L (ref 19–32)
Creatinine, Ser: 1.1 mg/dL (ref 0.4–1.2)
GFR: 57.83 mL/min — ABNORMAL LOW (ref >60.00)
Glucose, Bld: 85 mg/dL (ref 70–99)
POTASSIUM: 3.7 meq/L (ref 3.5–5.1)
Sodium: 139 mEq/L (ref 135–145)
Total Bilirubin: 0.6 mg/dL (ref 0.2–1.2)
Total Protein: 6.9 g/dL (ref 6.0–8.3)

## 2014-11-23 LAB — HEPATIC FUNCTION PANEL
ALBUMIN: 3.9 g/dL (ref 3.5–5.2)
ALT: 36 U/L — ABNORMAL HIGH (ref 0–35)
AST: 29 U/L (ref 0–37)
Alkaline Phosphatase: 67 U/L (ref 39–117)
BILIRUBIN DIRECT: 0.2 mg/dL (ref 0.0–0.3)
TOTAL PROTEIN: 6.9 g/dL (ref 6.0–8.3)
Total Bilirubin: 0.6 mg/dL (ref 0.2–1.2)

## 2014-11-23 MED ORDER — PREDNISONE 5 MG PO TABS: 5.0000 mg | ORAL_TABLET | Freq: Every day | ORAL | Status: DC

## 2014-11-23 NOTE — Telephone Encounter (Signed)
Spoke with pt and she is aware of results, script sent to pharmacy for Prednisone 5mg  tabs, order in epic for labs.

## 2014-12-07 ENCOUNTER — Other Ambulatory Visit (INDEPENDENT_AMBULATORY_CARE_PROVIDER_SITE_OTHER): Payer: 59

## 2014-12-07 DIAGNOSIS — R945 Abnormal results of liver function studies: Principal | ICD-10-CM

## 2014-12-07 DIAGNOSIS — R7989 Other specified abnormal findings of blood chemistry: Secondary | ICD-10-CM

## 2014-12-07 LAB — HEPATIC FUNCTION PANEL
ALT: 30 U/L (ref 0–35)
AST: 37 U/L (ref 0–37)
Albumin: 4.1 g/dL (ref 3.5–5.2)
Alkaline Phosphatase: 68 U/L (ref 39–117)
BILIRUBIN DIRECT: 0.2 mg/dL (ref 0.0–0.3)
BILIRUBIN TOTAL: 0.9 mg/dL (ref 0.2–1.2)
TOTAL PROTEIN: 7.2 g/dL (ref 6.0–8.3)

## 2014-12-21 ENCOUNTER — Telehealth: Payer: Self-pay

## 2014-12-21 ENCOUNTER — Other Ambulatory Visit (INDEPENDENT_AMBULATORY_CARE_PROVIDER_SITE_OTHER): Payer: 59

## 2014-12-21 DIAGNOSIS — R945 Abnormal results of liver function studies: Principal | ICD-10-CM

## 2014-12-21 DIAGNOSIS — R7989 Other specified abnormal findings of blood chemistry: Secondary | ICD-10-CM

## 2014-12-21 LAB — HEPATIC FUNCTION PANEL
ALK PHOS: 67 U/L (ref 39–117)
ALT: 23 U/L (ref 0–35)
AST: 30 U/L (ref 0–37)
Albumin: 4.1 g/dL (ref 3.5–5.2)
Bilirubin, Direct: 0.2 mg/dL (ref 0.0–0.3)
TOTAL PROTEIN: 7.3 g/dL (ref 6.0–8.3)
Total Bilirubin: 0.9 mg/dL (ref 0.2–1.2)

## 2014-12-21 NOTE — Telephone Encounter (Signed)
Pt to have labs done today.

## 2014-12-21 NOTE — Telephone Encounter (Signed)
-----   Message from Lily LovingsLinda Ray Hunt, RN sent at 12/08/2014  1:41 PM EST ----- Regarding: LFT's Pt needs LFT's in 2 weeks, order in epic.

## 2015-01-04 ENCOUNTER — Telehealth: Payer: Self-pay

## 2015-01-04 ENCOUNTER — Other Ambulatory Visit (INDEPENDENT_AMBULATORY_CARE_PROVIDER_SITE_OTHER): Payer: Self-pay

## 2015-01-04 DIAGNOSIS — K754 Autoimmune hepatitis: Secondary | ICD-10-CM

## 2015-01-04 DIAGNOSIS — R945 Abnormal results of liver function studies: Secondary | ICD-10-CM

## 2015-01-04 DIAGNOSIS — R7989 Other specified abnormal findings of blood chemistry: Secondary | ICD-10-CM

## 2015-01-04 LAB — HEPATIC FUNCTION PANEL
ALBUMIN: 4.3 g/dL (ref 3.5–5.2)
ALT: 20 U/L (ref 0–35)
AST: 33 U/L (ref 0–37)
Alkaline Phosphatase: 72 U/L (ref 39–117)
Bilirubin, Direct: 0.2 mg/dL (ref 0.0–0.3)
Total Bilirubin: 1.1 mg/dL (ref 0.2–1.2)
Total Protein: 7.4 g/dL (ref 6.0–8.3)

## 2015-01-04 NOTE — Telephone Encounter (Signed)
Pt aware.

## 2015-01-04 NOTE — Telephone Encounter (Signed)
-----   Message from Lily LovingsLinda Ray Hunt, RN sent at 12/21/2014  3:19 PM EST ----- Regarding: LFT's Pt needs labs, orders in epic.

## 2015-01-07 ENCOUNTER — Ambulatory Visit: Payer: 59 | Admitting: Internal Medicine

## 2015-01-18 ENCOUNTER — Other Ambulatory Visit (INDEPENDENT_AMBULATORY_CARE_PROVIDER_SITE_OTHER): Payer: 59

## 2015-01-18 DIAGNOSIS — K754 Autoimmune hepatitis: Secondary | ICD-10-CM

## 2015-01-18 DIAGNOSIS — R945 Abnormal results of liver function studies: Secondary | ICD-10-CM

## 2015-01-18 DIAGNOSIS — R7989 Other specified abnormal findings of blood chemistry: Secondary | ICD-10-CM | POA: Diagnosis not present

## 2015-01-18 LAB — HEPATIC FUNCTION PANEL
ALT: 17 U/L (ref 0–35)
AST: 28 U/L (ref 0–37)
Albumin: 4.4 g/dL (ref 3.5–5.2)
Alkaline Phosphatase: 89 U/L (ref 39–117)
Bilirubin, Direct: 0.1 mg/dL (ref 0.0–0.3)
Total Bilirubin: 0.5 mg/dL (ref 0.2–1.2)
Total Protein: 7.3 g/dL (ref 6.0–8.3)

## 2015-01-19 ENCOUNTER — Other Ambulatory Visit: Payer: Self-pay

## 2015-01-19 DIAGNOSIS — K754 Autoimmune hepatitis: Secondary | ICD-10-CM

## 2015-02-01 ENCOUNTER — Ambulatory Visit: Payer: Self-pay | Admitting: Internal Medicine

## 2015-02-09 ENCOUNTER — Ambulatory Visit: Payer: Self-pay | Admitting: Internal Medicine

## 2015-02-16 ENCOUNTER — Telehealth: Payer: Self-pay

## 2015-02-16 ENCOUNTER — Other Ambulatory Visit (INDEPENDENT_AMBULATORY_CARE_PROVIDER_SITE_OTHER): Payer: 59

## 2015-02-16 DIAGNOSIS — K754 Autoimmune hepatitis: Secondary | ICD-10-CM

## 2015-02-16 LAB — CBC WITH DIFFERENTIAL/PLATELET
BASOS PCT: 0.5 % (ref 0.0–3.0)
Basophils Absolute: 0 10*3/uL (ref 0.0–0.1)
EOS ABS: 0.3 10*3/uL (ref 0.0–0.7)
Eosinophils Relative: 6.3 % — ABNORMAL HIGH (ref 0.0–5.0)
HCT: 39.5 % (ref 36.0–46.0)
HEMOGLOBIN: 13.5 g/dL (ref 12.0–15.0)
LYMPHS PCT: 34.6 % (ref 12.0–46.0)
Lymphs Abs: 1.8 10*3/uL (ref 0.7–4.0)
MCHC: 34 g/dL (ref 30.0–36.0)
MCV: 97 fl (ref 78.0–100.0)
Monocytes Absolute: 0.5 10*3/uL (ref 0.1–1.0)
Monocytes Relative: 8.6 % (ref 3.0–12.0)
NEUTROS ABS: 2.6 10*3/uL (ref 1.4–7.7)
Neutrophils Relative %: 50 % (ref 43.0–77.0)
Platelets: 195 10*3/uL (ref 150.0–400.0)
RBC: 4.08 Mil/uL (ref 3.87–5.11)
RDW: 13.9 % (ref 11.5–15.5)
WBC: 5.3 10*3/uL (ref 4.0–10.5)

## 2015-02-16 LAB — HEPATIC FUNCTION PANEL
ALT: 12 U/L (ref 0–35)
AST: 22 U/L (ref 0–37)
Albumin: 4.4 g/dL (ref 3.5–5.2)
Alkaline Phosphatase: 77 U/L (ref 39–117)
BILIRUBIN DIRECT: 0.1 mg/dL (ref 0.0–0.3)
BILIRUBIN TOTAL: 0.6 mg/dL (ref 0.2–1.2)
Total Protein: 7.3 g/dL (ref 6.0–8.3)

## 2015-02-16 NOTE — Telephone Encounter (Signed)
Pt aware.

## 2015-02-16 NOTE — Telephone Encounter (Signed)
-----   Message from Lily LovingsLinda Ray Hunt, RN sent at 01/19/2015  9:23 AM EST ----- Regarding: Labs Pt needs labs, order in epic.

## 2015-02-17 ENCOUNTER — Other Ambulatory Visit: Payer: Self-pay

## 2015-02-17 DIAGNOSIS — K754 Autoimmune hepatitis: Secondary | ICD-10-CM

## 2015-03-08 ENCOUNTER — Ambulatory Visit (INDEPENDENT_AMBULATORY_CARE_PROVIDER_SITE_OTHER): Payer: 59 | Admitting: Internal Medicine

## 2015-03-08 ENCOUNTER — Encounter: Payer: Self-pay | Admitting: Internal Medicine

## 2015-03-08 VITALS — BP 124/90 | HR 100 | Ht 66.0 in | Wt 132.4 lb

## 2015-03-08 DIAGNOSIS — K754 Autoimmune hepatitis: Secondary | ICD-10-CM

## 2015-03-08 DIAGNOSIS — K7460 Unspecified cirrhosis of liver: Secondary | ICD-10-CM

## 2015-03-08 NOTE — Patient Instructions (Signed)
Please call when you are ready to schedule your injections

## 2015-03-08 NOTE — Progress Notes (Signed)
HISTORY OF PRESENT ILLNESS:  Rose Williams is a 54 y.o. female with autoimmune hepatitis diagnosed after presenting to the hospital September 2015 with markedly elevated liver tests and jaundice. Imaging studies revealed changes consistent with hepatic cirrhosis and mild splenic enlargement. She was initially treated with prednisone and subsequently converted to azathioprine. Currently taking azathioprine 100 mg daily. Liver tests have remained completely normal since December 2015. She was last seen in the office 11/09/2014. See that dictation for details. Her case has been discussed with [facility]. She is aware. She is not immune to hepatitis A or B and is to receive Twinrix vaccination series. GI review of systems is negative  REVIEW OF SYSTEMS:  All non-GI ROS negative upon review  Past Medical History  Diagnosis Date  . Kidney stones   . Pyelonephritis   . Hydronephrosis   . Jaundice   . Cirrhosis   . Autoimmune hepatitis     Past Surgical History  Procedure Laterality Date  . Lithotripsy      2008    Social History Rose Williams  reports that she has never smoked. She has never used smokeless tobacco. She reports that she does not drink alcohol or use illicit drugs.  family history includes Cancer in her mother; Cystic fibrosis in her child; Kidney Stones in an other family member.  Allergies  Allergen Reactions  . Dairy Aid [Lactase] Diarrhea and Nausea And Vomiting    Allergic to all dairy products  . Gluten Meal Diarrhea and Nausea And Vomiting  . Other Itching    Itching from melons, cantaloupe, eggplant  . Pseudoephedrine Other (See Comments)    tachycardia  . Whey Protein [Protein] Diarrhea and Nausea And Vomiting       PHYSICAL EXAMINATION: Vital signs: BP 124/90 mmHg  Pulse 100  Ht 5\' 6"  (1.676 m)  Wt 132 lb 6.4 oz (60.056 kg)  BMI 21.38 kg/m2 General: Well-developed, well-nourished, no acute distress HEENT: Sclerae are anicteric,  conjunctiva pink. Oral mucosa intact Lungs: Clear Heart: Regular Abdomen: soft, nontender, nondistended, no obvious ascites, no peritoneal signs, normal bowel sounds. No organomegaly. Extremities: No edema Psychiatric: alert and oriented x3. Cooperative     ASSESSMENT:  #1. Autoimmune hepatitis with radiographic evidence for cirrhosis. Clinically compensated. Asymptomatic on azathioprine 100 mg daily with normal LFTs #2. Colon cancer screening. Need to query patient regarding her status   PLAN:  #1. Continue azathioprine 100 mg daily #2. Continue periodic checks of liver tests and CBC as directed #3. Consider screening upper endoscopy for varices #4. Consider screening colonoscopy if not done previously #5. Twinrix vaccination series to begin. We also discussed other non-live vaccines that would be appropriate, as well as live vaccines which would not be appropriate, given suppressive therapy #6. Routine office follow-up 6 months. Sooner if clinically indicated #7. Needs PCP

## 2015-03-30 ENCOUNTER — Other Ambulatory Visit: Payer: Self-pay | Admitting: Internal Medicine

## 2015-04-20 ENCOUNTER — Telehealth: Payer: Self-pay

## 2015-04-20 NOTE — Telephone Encounter (Signed)
-----   Message from Lily LovingsLinda Ray Lissette Schenk, RN sent at 02/17/2015 11:23 AM EST ----- Regarding: Labs Pt due for labs, orders in epic.

## 2015-04-20 NOTE — Telephone Encounter (Signed)
Pt aware.

## 2015-04-27 ENCOUNTER — Other Ambulatory Visit (INDEPENDENT_AMBULATORY_CARE_PROVIDER_SITE_OTHER): Payer: 59

## 2015-04-27 DIAGNOSIS — K754 Autoimmune hepatitis: Secondary | ICD-10-CM | POA: Diagnosis not present

## 2015-04-27 LAB — HEPATIC FUNCTION PANEL
ALBUMIN: 4.7 g/dL (ref 3.5–5.2)
ALT: 29 U/L (ref 0–35)
AST: 18 U/L (ref 0–37)
Alkaline Phosphatase: 82 U/L (ref 39–117)
BILIRUBIN DIRECT: 0.2 mg/dL (ref 0.0–0.3)
TOTAL PROTEIN: 7.2 g/dL (ref 6.0–8.3)
Total Bilirubin: 0.7 mg/dL (ref 0.2–1.2)

## 2015-04-27 LAB — CBC WITH DIFFERENTIAL/PLATELET
Basophils Absolute: 0 10*3/uL (ref 0.0–0.1)
Basophils Relative: 0.3 % (ref 0.0–3.0)
Eosinophils Absolute: 0.3 10*3/uL (ref 0.0–0.7)
Eosinophils Relative: 4.3 % (ref 0.0–5.0)
HEMATOCRIT: 40.1 % (ref 36.0–46.0)
HEMOGLOBIN: 13.7 g/dL (ref 12.0–15.0)
Lymphocytes Relative: 29.7 % (ref 12.0–46.0)
Lymphs Abs: 1.9 10*3/uL (ref 0.7–4.0)
MCHC: 34.2 g/dL (ref 30.0–36.0)
MCV: 100.2 fl — AB (ref 78.0–100.0)
Monocytes Absolute: 0.4 10*3/uL (ref 0.1–1.0)
Monocytes Relative: 6.3 % (ref 3.0–12.0)
Neutro Abs: 3.7 10*3/uL (ref 1.4–7.7)
Neutrophils Relative %: 59.4 % (ref 43.0–77.0)
Platelets: 179 10*3/uL (ref 150.0–400.0)
RBC: 4 Mil/uL (ref 3.87–5.11)
RDW: 13.9 % (ref 11.5–15.5)
WBC: 6.3 10*3/uL (ref 4.0–10.5)

## 2015-04-28 ENCOUNTER — Other Ambulatory Visit: Payer: Self-pay

## 2015-04-28 DIAGNOSIS — R7989 Other specified abnormal findings of blood chemistry: Secondary | ICD-10-CM

## 2015-04-28 DIAGNOSIS — R945 Abnormal results of liver function studies: Principal | ICD-10-CM

## 2015-06-20 ENCOUNTER — Telehealth: Payer: Self-pay | Admitting: *Deleted

## 2015-06-20 NOTE — Telephone Encounter (Signed)
Left a message to call back.

## 2015-06-20 NOTE — Telephone Encounter (Signed)
-----   Message from Chrystie Nose, RN sent at 04/28/2015  1:55 PM EDT ----- Regarding: Labs Pt needs labs in 2 mth, order in epic.

## 2015-06-21 NOTE — Telephone Encounter (Signed)
Patient will come tomorrow for labs

## 2015-06-21 NOTE — Telephone Encounter (Signed)
Left a message for patient to back.

## 2015-06-23 ENCOUNTER — Other Ambulatory Visit (INDEPENDENT_AMBULATORY_CARE_PROVIDER_SITE_OTHER): Payer: 59

## 2015-06-23 DIAGNOSIS — R945 Abnormal results of liver function studies: Secondary | ICD-10-CM

## 2015-06-23 DIAGNOSIS — R7989 Other specified abnormal findings of blood chemistry: Secondary | ICD-10-CM | POA: Diagnosis not present

## 2015-06-23 DIAGNOSIS — K754 Autoimmune hepatitis: Secondary | ICD-10-CM | POA: Diagnosis not present

## 2015-06-23 LAB — HEPATIC FUNCTION PANEL
ALBUMIN: 4.4 g/dL (ref 3.5–5.2)
ALT: 9 U/L (ref 0–35)
AST: 16 U/L (ref 0–37)
Alkaline Phosphatase: 75 U/L (ref 39–117)
Bilirubin, Direct: 0.2 mg/dL (ref 0.0–0.3)
TOTAL PROTEIN: 7.3 g/dL (ref 6.0–8.3)
Total Bilirubin: 0.9 mg/dL (ref 0.2–1.2)

## 2015-06-23 LAB — CBC WITH DIFFERENTIAL/PLATELET
Basophils Absolute: 0 10*3/uL (ref 0.0–0.1)
Basophils Relative: 0.4 % (ref 0.0–3.0)
EOS PCT: 6.3 % — AB (ref 0.0–5.0)
Eosinophils Absolute: 0.4 10*3/uL (ref 0.0–0.7)
HEMATOCRIT: 39.8 % (ref 36.0–46.0)
Hemoglobin: 13.4 g/dL (ref 12.0–15.0)
LYMPHS ABS: 1.9 10*3/uL (ref 0.7–4.0)
Lymphocytes Relative: 33.9 % (ref 12.0–46.0)
MCHC: 33.8 g/dL (ref 30.0–36.0)
MCV: 101.5 fl — AB (ref 78.0–100.0)
MONOS PCT: 6.9 % (ref 3.0–12.0)
Monocytes Absolute: 0.4 10*3/uL (ref 0.1–1.0)
NEUTROS ABS: 2.9 10*3/uL (ref 1.4–7.7)
Neutrophils Relative %: 52.5 % (ref 43.0–77.0)
Platelets: 183 10*3/uL (ref 150.0–400.0)
RBC: 3.92 Mil/uL (ref 3.87–5.11)
RDW: 13.3 % (ref 11.5–15.5)
WBC: 5.6 10*3/uL (ref 4.0–10.5)

## 2015-06-24 ENCOUNTER — Other Ambulatory Visit: Payer: Self-pay

## 2015-06-24 DIAGNOSIS — K754 Autoimmune hepatitis: Secondary | ICD-10-CM

## 2015-08-07 ENCOUNTER — Other Ambulatory Visit: Payer: Self-pay | Admitting: Internal Medicine

## 2015-09-14 IMAGING — CT CT ABD-PELV W/ CM
2 of 5 series · 14 of 46 positions shown, 16 images · IV contrast (omnipaque)
Comparison: 11/24/2007

CLINICAL DATA: Nausea. Hepatic dysfunction. Jaundice. Pain and
nausea after eating.

EXAM:
CT ABDOMEN AND PELVIS WITH CONTRAST
TECHNIQUE: Multidetector CT imaging of the abdomen and pelvis was performed
using the standard protocol following bolus administration of
intravenous contrast.
CONTRAST:  100mL OMNIPAQUE IOHEXOL 300 MG/ML  SOLN

[Series 2: abd/ pelvis 5.0 i30f 1 · axial · 0.68mm/px · z∈[-498,-94]mm · 11 of 91 slices shown, 13 images]
[im 5/91  soft-tissue]
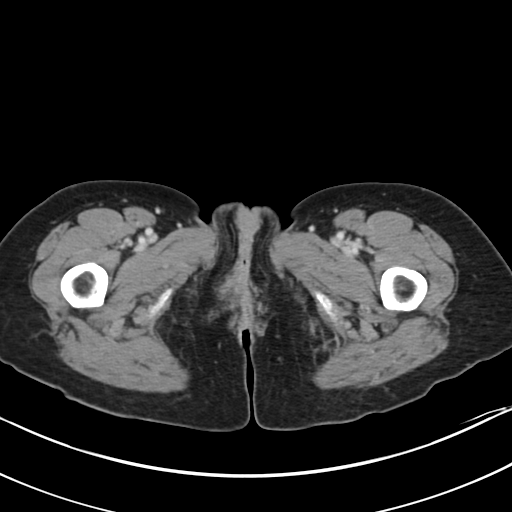
[im 5/91  bone]
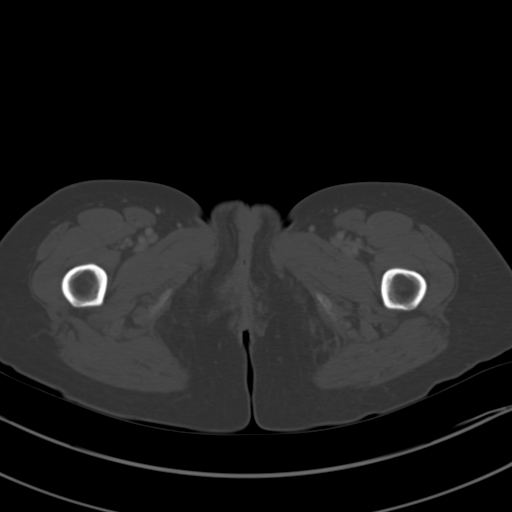
[im 15/91  soft-tissue]
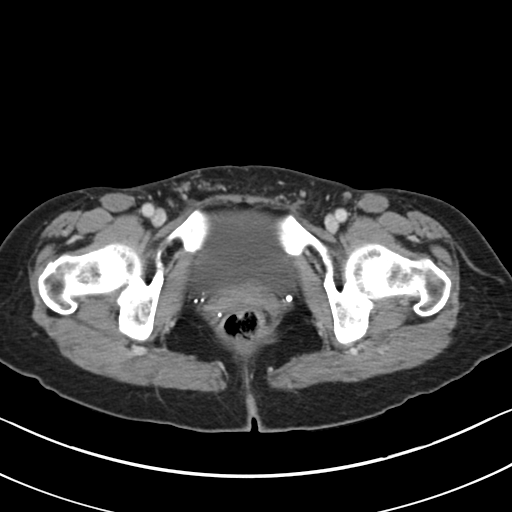
[im 24/91  soft-tissue]
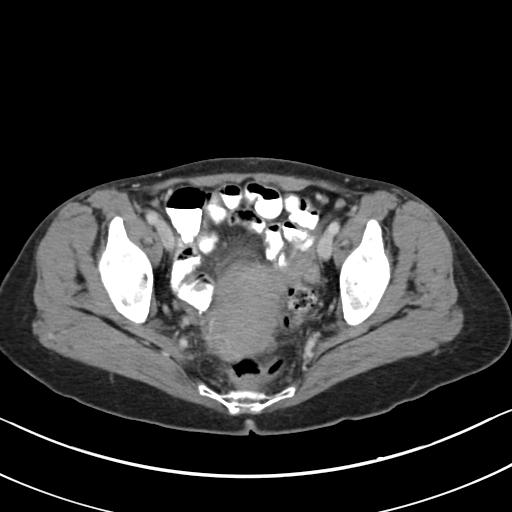
[im 29/91  soft-tissue]
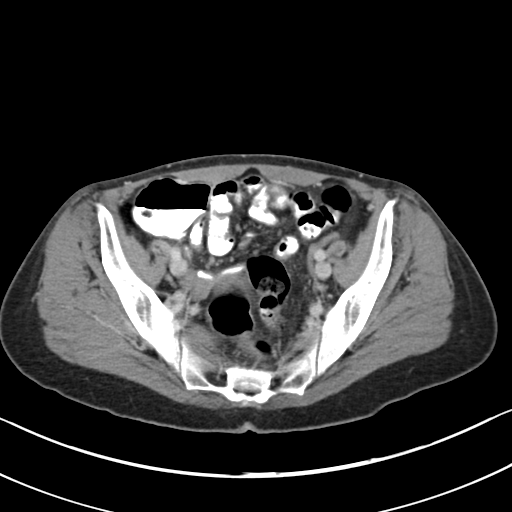
[im 38/91  soft-tissue]
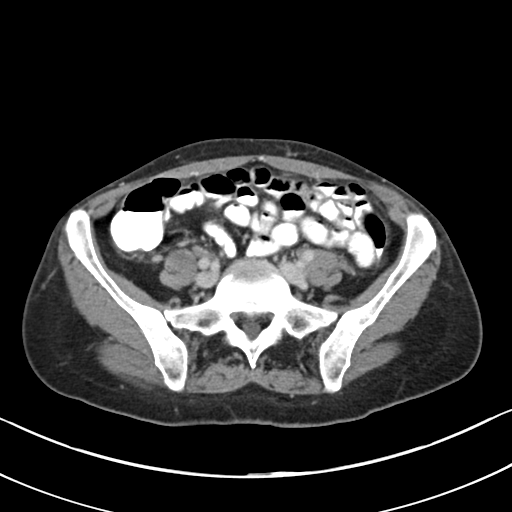
[im 48/91  soft-tissue]
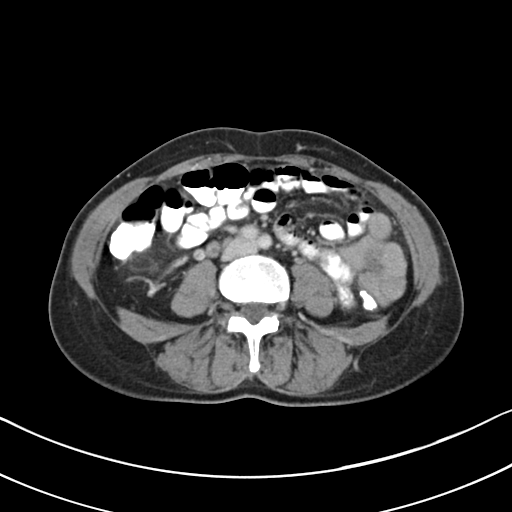
[im 53/91  soft-tissue]
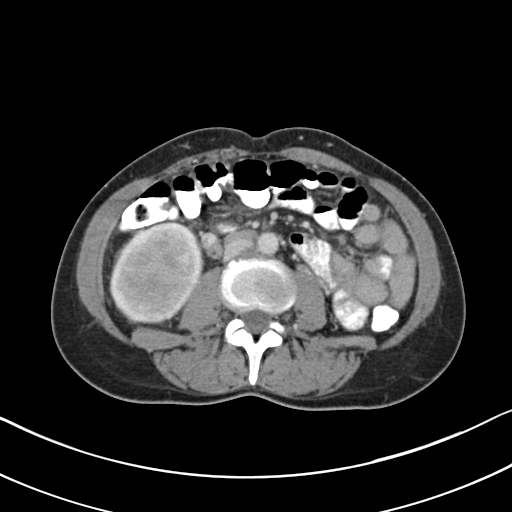
[im 62/91  soft-tissue]
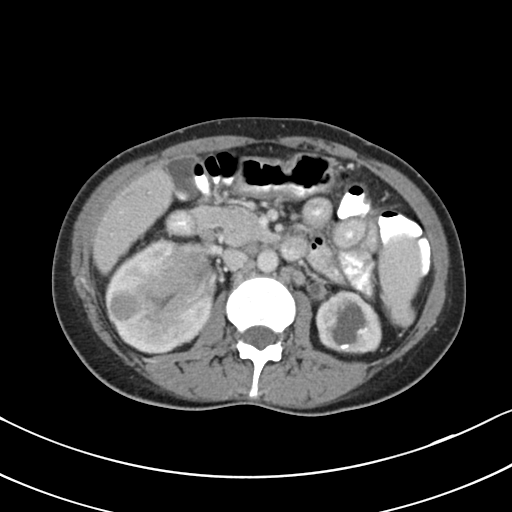
[im 67/91  soft-tissue]
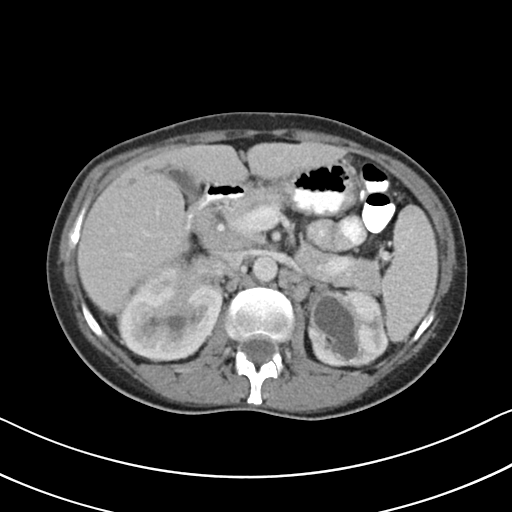
[im 67/91  bone]
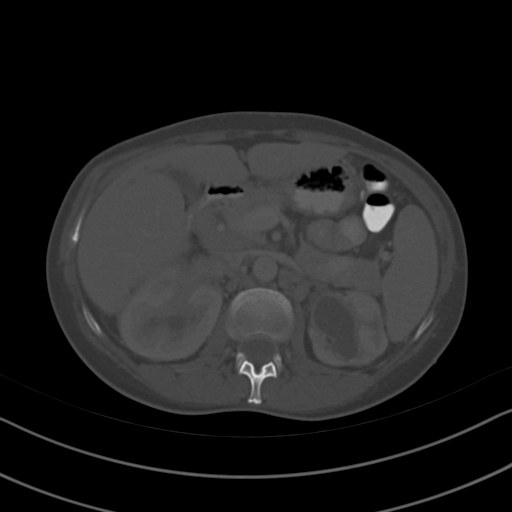
[im 76/91  soft-tissue]
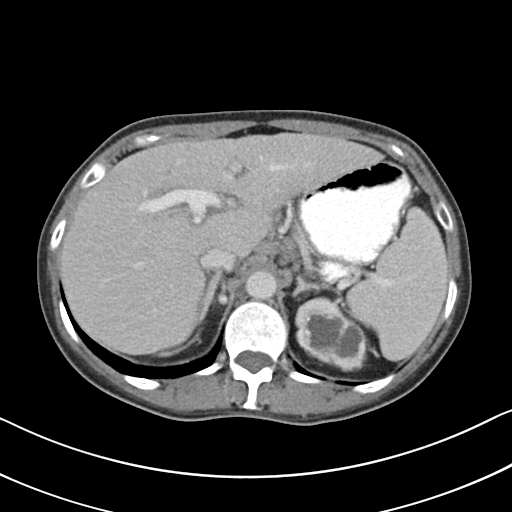
[im 86/91  soft-tissue]
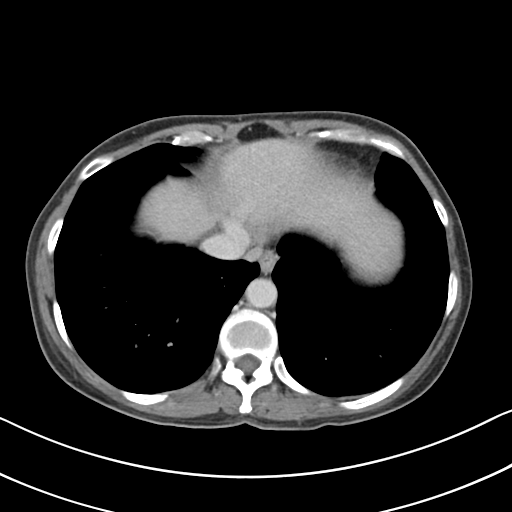

[Series 5: coronals · coronal · 0.67mm/px · 3 of 97 slices shown]
[im 33/97  soft-tissue]
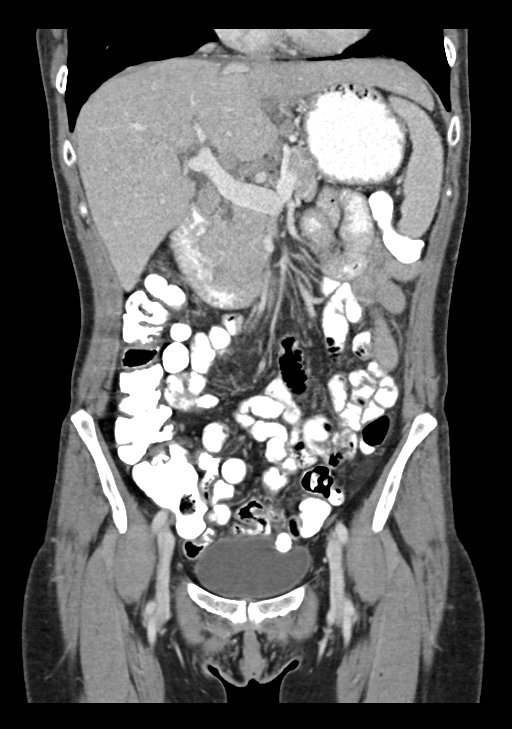
[im 43/97  soft-tissue]
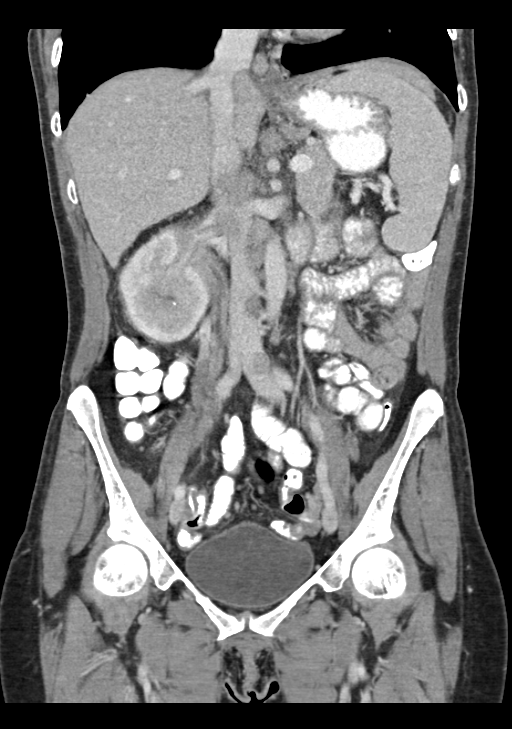
[im 54/97  soft-tissue]
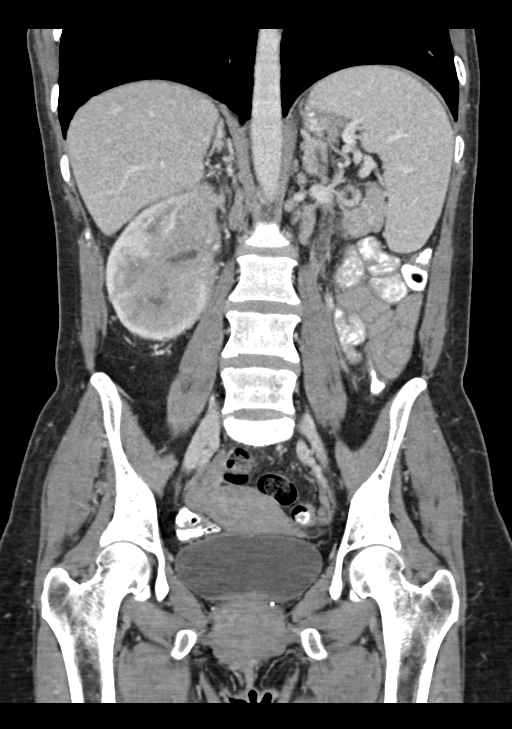

[14 of 46 positions shown; findings below may reference images not displayed]

FINDINGS: Lung bases are clear.

Cirrhotic configuration of the liver with enlarged lateral segment
of the left lobe and nodular contour. No focal liver lesions
demonstrated. Spleen is mildly enlarged. Small accessory spleen. The
gallbladder is normal. No bile duct dilatation. Pancreas, adrenal
glands, abdominal aorta, and inferior vena cava are unremarkable.
The kidneys are distinctly abnormal. The right kidney is diffusely
enlarged with abnormal nephrogram. The renal pelvis and ureter are
not distended but there is prominent wall thickening throughout the
ureter and of the pelvocaliceal system with infiltration of the
renal sinus fat. This does not appear to represent a discrete mass.
There are small cysts present. Appearance is most likely to
represent severe pyelonephritis although infiltrating neoplasm or
lymphoma is not excluded. The left kidney demonstrates
hydronephrosis without hydroureter. Mild thickening of the ureteral
wall and pyelocaliceal system. Diffuse parenchymal atrophy. Changes
likely represent reflux nephropathy, probably also with
pyelonephritis. Calcifications demonstrated throughout the left
kidney consistent with stones and seen on previous study. Prominent
retroperitoneal lymph nodes mostly in the periaortic region and also
in the celiac axis. Lymph nodes measure up to about 22 mm short axis
dimension. These are nonspecific and could represent reactive or
inflammatory nodes versus lymphoproliferative change or neoplasm.
The stomach, small bowel, and colon are unremarkable. Contrast
material flows through to the colon without evidence of bowel
obstruction. No free air or free fluid in the abdomen.

Pelvis: Bladder wall is not thickened. Uterus and ovaries are not
enlarged. Appendix is normal. No free or loculated pelvic fluid
collections. No pelvic lymphadenopathy. No destructive bone lesions.
IMPRESSION: Kidneys and ureters are abnormal bilaterally. Right kidney
demonstrates diffuse enlargement with abnormal nephrogram and with
prominent wall thickening and infiltration around the right renal
pelvis and ureter. Consider severe pyelonephritis and ureteritis
versus infiltrating neoplasm or lymphoma.

Left kidney demonstrates hydronephrosis with multiple stones,
thickening of the urothelium, and parenchymal atrophy. Changes may
represent pyelonephritis or reflux nephropathy. Changes of the left
may represent a more chronic stage of similar changes on the right.

Changes of hepatic cirrhosis with mild splenic enlargement.

Nonspecific lymphadenopathy in the retroperitoneum and celiac axis.

## 2015-09-19 ENCOUNTER — Telehealth: Payer: Self-pay

## 2015-09-19 NOTE — Telephone Encounter (Signed)
-----   Message from Chrystie Nose, RN sent at 06/24/2015 10:57 AM EDT ----- Regarding: Labs Pt to have labs, orders in epic.

## 2015-09-19 NOTE — Telephone Encounter (Signed)
Pt aware.

## 2015-09-22 ENCOUNTER — Other Ambulatory Visit: Payer: Self-pay

## 2015-09-22 ENCOUNTER — Other Ambulatory Visit (INDEPENDENT_AMBULATORY_CARE_PROVIDER_SITE_OTHER): Payer: 59

## 2015-09-22 DIAGNOSIS — K754 Autoimmune hepatitis: Secondary | ICD-10-CM

## 2015-09-22 LAB — HEPATIC FUNCTION PANEL
ALBUMIN: 4.6 g/dL (ref 3.5–5.2)
ALT: 10 U/L (ref 0–35)
AST: 17 U/L (ref 0–37)
Alkaline Phosphatase: 83 U/L (ref 39–117)
Bilirubin, Direct: 0.2 mg/dL (ref 0.0–0.3)
TOTAL PROTEIN: 7.8 g/dL (ref 6.0–8.3)
Total Bilirubin: 0.7 mg/dL (ref 0.2–1.2)

## 2015-09-22 LAB — CBC WITH DIFFERENTIAL/PLATELET
BASOS PCT: 0.3 % (ref 0.0–3.0)
Basophils Absolute: 0 10*3/uL (ref 0.0–0.1)
EOS PCT: 6.8 % — AB (ref 0.0–5.0)
Eosinophils Absolute: 0.3 10*3/uL (ref 0.0–0.7)
HEMATOCRIT: 40.7 % (ref 36.0–46.0)
HEMOGLOBIN: 13.9 g/dL (ref 12.0–15.0)
LYMPHS PCT: 27.1 % (ref 12.0–46.0)
Lymphs Abs: 1.3 10*3/uL (ref 0.7–4.0)
MCHC: 34.1 g/dL (ref 30.0–36.0)
MCV: 100 fl (ref 78.0–100.0)
MONOS PCT: 8 % (ref 3.0–12.0)
Monocytes Absolute: 0.4 10*3/uL (ref 0.1–1.0)
NEUTROS ABS: 2.9 10*3/uL (ref 1.4–7.7)
Neutrophils Relative %: 57.8 % (ref 43.0–77.0)
PLATELETS: 175 10*3/uL (ref 150.0–400.0)
RBC: 4.07 Mil/uL (ref 3.87–5.11)
RDW: 13.4 % (ref 11.5–15.5)
WBC: 5 10*3/uL (ref 4.0–10.5)

## 2015-12-19 ENCOUNTER — Telehealth: Payer: Self-pay

## 2015-12-19 NOTE — Telephone Encounter (Signed)
-----   Message from Chrystie NoseLinda R Hunt, RN sent at 09/22/2015  4:17 PM EDT ----- Regarding: Labs Pt needs labs in December, orders in epic.

## 2015-12-19 NOTE — Telephone Encounter (Signed)
Pt states she will come this week for labs.

## 2015-12-20 ENCOUNTER — Other Ambulatory Visit (INDEPENDENT_AMBULATORY_CARE_PROVIDER_SITE_OTHER): Payer: 59

## 2015-12-20 DIAGNOSIS — K754 Autoimmune hepatitis: Secondary | ICD-10-CM

## 2015-12-20 LAB — CBC WITH DIFFERENTIAL/PLATELET
Basophils Absolute: 0 10*3/uL (ref 0.0–0.1)
Basophils Relative: 0.4 % (ref 0.0–3.0)
EOS PCT: 7.5 % — AB (ref 0.0–5.0)
Eosinophils Absolute: 0.4 10*3/uL (ref 0.0–0.7)
HEMATOCRIT: 41.7 % (ref 36.0–46.0)
HEMOGLOBIN: 13.9 g/dL (ref 12.0–15.0)
LYMPHS ABS: 1.7 10*3/uL (ref 0.7–4.0)
Lymphocytes Relative: 30.4 % (ref 12.0–46.0)
MCHC: 33.2 g/dL (ref 30.0–36.0)
MCV: 101.3 fl — AB (ref 78.0–100.0)
MONOS PCT: 8.3 % (ref 3.0–12.0)
Monocytes Absolute: 0.5 10*3/uL (ref 0.1–1.0)
Neutro Abs: 3 10*3/uL (ref 1.4–7.7)
Neutrophils Relative %: 53.4 % (ref 43.0–77.0)
Platelets: 183 10*3/uL (ref 150.0–400.0)
RBC: 4.12 Mil/uL (ref 3.87–5.11)
RDW: 13.3 % (ref 11.5–15.5)
WBC: 5.5 10*3/uL (ref 4.0–10.5)

## 2015-12-20 LAB — HEPATIC FUNCTION PANEL
ALBUMIN: 4.5 g/dL (ref 3.5–5.2)
ALT: 9 U/L (ref 0–35)
AST: 17 U/L (ref 0–37)
Alkaline Phosphatase: 89 U/L (ref 39–117)
Bilirubin, Direct: 0.1 mg/dL (ref 0.0–0.3)
TOTAL PROTEIN: 7.5 g/dL (ref 6.0–8.3)
Total Bilirubin: 0.6 mg/dL (ref 0.2–1.2)

## 2015-12-21 ENCOUNTER — Other Ambulatory Visit: Payer: Self-pay

## 2015-12-21 DIAGNOSIS — K754 Autoimmune hepatitis: Secondary | ICD-10-CM

## 2015-12-23 ENCOUNTER — Other Ambulatory Visit: Payer: Self-pay | Admitting: Internal Medicine

## 2016-03-14 ENCOUNTER — Telehealth: Payer: Self-pay

## 2016-03-14 NOTE — Telephone Encounter (Signed)
Pt aware.

## 2016-03-14 NOTE — Telephone Encounter (Signed)
-----   Message from Chrystie NoseLinda R Bard Haupert, RN sent at 12/21/2015  1:26 PM EST ----- Regarding: labs Pt needs labs in 3 mth, orders in epic.

## 2016-03-15 ENCOUNTER — Other Ambulatory Visit (INDEPENDENT_AMBULATORY_CARE_PROVIDER_SITE_OTHER): Payer: 59

## 2016-03-15 DIAGNOSIS — K754 Autoimmune hepatitis: Secondary | ICD-10-CM | POA: Diagnosis not present

## 2016-03-15 LAB — CBC WITH DIFFERENTIAL/PLATELET
BASOS ABS: 0 10*3/uL (ref 0.0–0.1)
Basophils Relative: 0.3 % (ref 0.0–3.0)
EOS ABS: 0.3 10*3/uL (ref 0.0–0.7)
Eosinophils Relative: 5.2 % — ABNORMAL HIGH (ref 0.0–5.0)
HEMATOCRIT: 38.5 % (ref 36.0–46.0)
HEMOGLOBIN: 12.9 g/dL (ref 12.0–15.0)
LYMPHS PCT: 25.5 % (ref 12.0–46.0)
Lymphs Abs: 1.3 10*3/uL (ref 0.7–4.0)
MCHC: 33.7 g/dL (ref 30.0–36.0)
MCV: 98.2 fl (ref 78.0–100.0)
Monocytes Absolute: 0.3 10*3/uL (ref 0.1–1.0)
Monocytes Relative: 6.7 % (ref 3.0–12.0)
Neutro Abs: 3.2 10*3/uL (ref 1.4–7.7)
Neutrophils Relative %: 62.3 % (ref 43.0–77.0)
PLATELETS: 170 10*3/uL (ref 150.0–400.0)
RBC: 3.92 Mil/uL (ref 3.87–5.11)
RDW: 13.3 % (ref 11.5–15.5)
WBC: 5.1 10*3/uL (ref 4.0–10.5)

## 2016-03-15 LAB — HEPATIC FUNCTION PANEL
ALK PHOS: 76 U/L (ref 39–117)
ALT: 8 U/L (ref 0–35)
AST: 15 U/L (ref 0–37)
Albumin: 4.3 g/dL (ref 3.5–5.2)
BILIRUBIN DIRECT: 0.1 mg/dL (ref 0.0–0.3)
TOTAL PROTEIN: 7.2 g/dL (ref 6.0–8.3)
Total Bilirubin: 0.6 mg/dL (ref 0.2–1.2)

## 2016-03-16 ENCOUNTER — Other Ambulatory Visit: Payer: Self-pay

## 2016-03-16 DIAGNOSIS — Z79899 Other long term (current) drug therapy: Secondary | ICD-10-CM

## 2016-03-16 DIAGNOSIS — Z7960 Long term (current) use of unspecified immunomodulators and immunosuppressants: Secondary | ICD-10-CM

## 2016-05-04 ENCOUNTER — Other Ambulatory Visit: Payer: Self-pay | Admitting: Internal Medicine

## 2016-06-18 ENCOUNTER — Telehealth: Payer: Self-pay

## 2016-06-18 NOTE — Telephone Encounter (Signed)
Pt aware.

## 2016-06-18 NOTE — Telephone Encounter (Signed)
-----   Message from Annett FabianSheri L Jones, RN sent at 03/16/2016 12:07 PM EDT ----- Needs labs- Rose GoodellPerry patient

## 2016-06-20 ENCOUNTER — Other Ambulatory Visit (INDEPENDENT_AMBULATORY_CARE_PROVIDER_SITE_OTHER): Payer: 59

## 2016-06-20 DIAGNOSIS — Z7960 Long term (current) use of unspecified immunomodulators and immunosuppressants: Secondary | ICD-10-CM

## 2016-06-20 DIAGNOSIS — Z79899 Other long term (current) drug therapy: Secondary | ICD-10-CM | POA: Diagnosis not present

## 2016-06-20 LAB — CBC WITH DIFFERENTIAL/PLATELET
Basophils Absolute: 0 10*3/uL (ref 0.0–0.1)
Basophils Relative: 0.2 % (ref 0.0–3.0)
EOS ABS: 0.2 10*3/uL (ref 0.0–0.7)
EOS PCT: 5.2 % — AB (ref 0.0–5.0)
HCT: 39.1 % (ref 36.0–46.0)
HEMOGLOBIN: 13.2 g/dL (ref 12.0–15.0)
LYMPHS ABS: 1.2 10*3/uL (ref 0.7–4.0)
Lymphocytes Relative: 27.6 % (ref 12.0–46.0)
MCHC: 33.7 g/dL (ref 30.0–36.0)
MCV: 100.1 fl — ABNORMAL HIGH (ref 78.0–100.0)
MONO ABS: 0.4 10*3/uL (ref 0.1–1.0)
Monocytes Relative: 9.1 % (ref 3.0–12.0)
NEUTROS PCT: 57.9 % (ref 43.0–77.0)
Neutro Abs: 2.5 10*3/uL (ref 1.4–7.7)
Platelets: 163 10*3/uL (ref 150.0–400.0)
RBC: 3.91 Mil/uL (ref 3.87–5.11)
RDW: 13.2 % (ref 11.5–15.5)
WBC: 4.4 10*3/uL (ref 4.0–10.5)

## 2016-06-20 LAB — HEPATIC FUNCTION PANEL
ALT: 8 U/L (ref 0–35)
AST: 14 U/L (ref 0–37)
Albumin: 4.3 g/dL (ref 3.5–5.2)
Alkaline Phosphatase: 71 U/L (ref 39–117)
BILIRUBIN DIRECT: 0.2 mg/dL (ref 0.0–0.3)
BILIRUBIN TOTAL: 0.9 mg/dL (ref 0.2–1.2)
Total Protein: 7 g/dL (ref 6.0–8.3)

## 2016-06-21 ENCOUNTER — Other Ambulatory Visit: Payer: Self-pay

## 2016-06-21 DIAGNOSIS — K754 Autoimmune hepatitis: Secondary | ICD-10-CM

## 2016-08-20 ENCOUNTER — Ambulatory Visit: Payer: 59 | Admitting: Internal Medicine

## 2016-09-18 ENCOUNTER — Other Ambulatory Visit (INDEPENDENT_AMBULATORY_CARE_PROVIDER_SITE_OTHER): Payer: 59

## 2016-09-18 ENCOUNTER — Other Ambulatory Visit: Payer: Self-pay

## 2016-09-18 DIAGNOSIS — K754 Autoimmune hepatitis: Secondary | ICD-10-CM

## 2016-09-18 LAB — CBC WITH DIFFERENTIAL/PLATELET
Basophils Absolute: 0 10*3/uL (ref 0.0–0.1)
Basophils Relative: 0.2 % (ref 0.0–3.0)
EOS PCT: 3.1 % (ref 0.0–5.0)
Eosinophils Absolute: 0.2 10*3/uL (ref 0.0–0.7)
HEMATOCRIT: 40.6 % (ref 36.0–46.0)
Hemoglobin: 13.8 g/dL (ref 12.0–15.0)
LYMPHS ABS: 1.2 10*3/uL (ref 0.7–4.0)
Lymphocytes Relative: 22.7 % (ref 12.0–46.0)
MCHC: 33.9 g/dL (ref 30.0–36.0)
MCV: 99.1 fl (ref 78.0–100.0)
MONOS PCT: 8.5 % (ref 3.0–12.0)
Monocytes Absolute: 0.5 10*3/uL (ref 0.1–1.0)
NEUTROS ABS: 3.5 10*3/uL (ref 1.4–7.7)
NEUTROS PCT: 65.5 % (ref 43.0–77.0)
PLATELETS: 164 10*3/uL (ref 150.0–400.0)
RBC: 4.09 Mil/uL (ref 3.87–5.11)
RDW: 13.3 % (ref 11.5–15.5)
WBC: 5.4 10*3/uL (ref 4.0–10.5)

## 2016-09-18 LAB — HEPATIC FUNCTION PANEL
ALBUMIN: 4.4 g/dL (ref 3.5–5.2)
ALT: 8 U/L (ref 0–35)
AST: 15 U/L (ref 0–37)
Alkaline Phosphatase: 71 U/L (ref 39–117)
Bilirubin, Direct: 0.2 mg/dL (ref 0.0–0.3)
TOTAL PROTEIN: 7.4 g/dL (ref 6.0–8.3)
Total Bilirubin: 0.9 mg/dL (ref 0.2–1.2)

## 2016-09-25 ENCOUNTER — Ambulatory Visit (INDEPENDENT_AMBULATORY_CARE_PROVIDER_SITE_OTHER): Payer: 59 | Admitting: Internal Medicine

## 2016-09-25 ENCOUNTER — Encounter: Payer: Self-pay | Admitting: Internal Medicine

## 2016-09-25 VITALS — BP 150/86 | HR 84 | Ht 66.0 in | Wt 125.0 lb

## 2016-09-25 DIAGNOSIS — K754 Autoimmune hepatitis: Secondary | ICD-10-CM | POA: Diagnosis not present

## 2016-09-25 DIAGNOSIS — Z23 Encounter for immunization: Secondary | ICD-10-CM | POA: Diagnosis not present

## 2016-09-25 DIAGNOSIS — K7460 Unspecified cirrhosis of liver: Secondary | ICD-10-CM

## 2016-09-25 NOTE — Progress Notes (Signed)
HISTORY OF PRESENT ILLNESS:  Rose Williams is a 55 y.o. female with autoimmune hepatitis diagnosed after presenting to the hospital September 2015 with markedly elevated liver tests and jaundice. Imaging studies revealed changes consistent with hepatic cirrhosis and mild splenic enlargement. She was initially treated with prednisone and subsequently converted to azathioprine. Currently on azathioprine 100 mg daily. Liver tests have remained completely normal since December 2015. She was last seen in the office 03/08/2015. See that dictation for details. Her case has been discussed with [facility] T[facility]Andrew Mueller at [facility]. She is aware. She is not immune to hepatitis A or B. Twinrix previously recommended. She has not followed through. I also quizzed her regarding prior colon cancer screening. She has not had, but wants to consider/schedule screening colonoscopy at her nearest convenience. She has had regular blood work about every 3 months. CBCs and liver tests have remained normal. Most recently last week. She has no complaints. She denies jaundice, edema, bleeding, or other relevant issues. No interval medical problems. She did receive her masters. She continues to work at [employer]  REVIEW OF SYSTEMS:  All non-GI ROS negative except for allergies  Past Medical History:  Diagnosis Date  . Autoimmune hepatitis (HCC)   . Cirrhosis (HCC)   . Hydronephrosis   . Jaundice   . Kidney stones   . Pyelonephritis     Past Surgical History:  Procedure Laterality Date  . LITHOTRIPSY     2008    Social History Rose Williams  reports that she has never smoked. She has never used smokeless tobacco. She reports that she does not drink alcohol or use drugs.  family history includes Cancer in her mother; Cystic fibrosis in her child.  Allergies  Allergen Reactions  . Dairy Aid [Lactase] Diarrhea and Nausea And Vomiting    Allergic to all dairy products  . Gluten Meal Diarrhea and Nausea And  Vomiting  . Other Itching    Itching from melons, cantaloupe, eggplant  . Pseudoephedrine Other (See Comments)    tachycardia  . Whey Protein [Protein] Diarrhea and Nausea And Vomiting       PHYSICAL EXAMINATION: Vital signs: BP (!) 150/86   Pulse 84   Ht 5\' 6"  (1.676 m)   Wt 125 lb 0.6 oz (56.7 kg)   BMI 20.18 kg/m   Constitutional: generally well-appearing, no acute distress Psychiatric: alert and oriented x3, cooperative Eyes: extraocular movements intact, anicteric, conjunctiva pink Mouth: oral pharynx moist, no lesions. Tongue hue normal Neck: supple no lymphadenopathy Cardiovascular: heart regular rate and rhythm, no murmur Lungs: clear to auscultation bilaterally Abdomen: soft, nontender, nondistended, no obvious ascites, no peritoneal signs, normal bowel sounds, no organomegaly Rectal: Omitted Extremities: no clubbing cyanosis or lower extremity edema bilaterally Skin: no lesions on visible extremities Neuro: No focal deficits. No asterixis.    ASSESSMENT:  #1. Autoimmune hepatitis with radiographic evidence for cirrhosis. Clinically compensated. Asymptomatic on azathioprine 100 mg daily with normal LFTs and CBC. Platelets low normal. #2. Hepatitis A and B nave #3. Colon cancer screening. Has not had   PLAN:  #1. Continue azathioprine 100 mg daily #2. Continue with periodic blood work as ordered #3. Initiate Twinrix vaccination series #4. The patient to contact the office to schedule colonoscopy for the purposes of colon cancer screening #5. Liver ultrasound given changes of cirrhosis. Rule out HCC #6. Routine office follow-up for ongoing management of autoimmune hepatitis one year. Sooner if needed #7. Needs PCP

## 2016-09-25 NOTE — Patient Instructions (Signed)
I will call you with your liver ultrasound  You have been given your 1st twin rix vaccination today   Please come back on 10/02/2016 at :

## 2016-09-28 ENCOUNTER — Telehealth: Payer: Self-pay

## 2016-09-28 NOTE — Telephone Encounter (Signed)
[facility] to [facility], 10/19/2016 at 3:00pm.  Called patient with this information and instructions:  Arrive 15 minutes early and nothing by mouth 6 hours prior.  Patient acknowledged and understood

## 2016-10-03 ENCOUNTER — Telehealth: Payer: Self-pay | Admitting: Internal Medicine

## 2016-10-03 ENCOUNTER — Other Ambulatory Visit: Payer: Self-pay | Admitting: Internal Medicine

## 2016-10-03 NOTE — Telephone Encounter (Signed)
Scheduled patient for 2nd twinrix for 10/05/2016 at 2:00pm.  Patient agreed

## 2016-10-08 ENCOUNTER — Ambulatory Visit (INDEPENDENT_AMBULATORY_CARE_PROVIDER_SITE_OTHER): Payer: 59 | Admitting: Internal Medicine

## 2016-10-08 DIAGNOSIS — Z23 Encounter for immunization: Secondary | ICD-10-CM | POA: Diagnosis not present

## 2016-10-08 DIAGNOSIS — K754 Autoimmune hepatitis: Secondary | ICD-10-CM

## 2016-10-19 ENCOUNTER — Ambulatory Visit (HOSPITAL_COMMUNITY): Payer: 59

## 2016-10-26 ENCOUNTER — Ambulatory Visit (HOSPITAL_COMMUNITY)
Admission: RE | Admit: 2016-10-26 | Discharge: 2016-10-26 | Disposition: A | Discharge status: Home / Self Care | Payer: 59 | Source: Ambulatory Visit | Attending: Internal Medicine | Admitting: Internal Medicine

## 2016-10-26 ENCOUNTER — Ambulatory Visit (INDEPENDENT_AMBULATORY_CARE_PROVIDER_SITE_OTHER): Payer: 59 | Admitting: Internal Medicine

## 2016-10-26 DIAGNOSIS — K754 Autoimmune hepatitis: Secondary | ICD-10-CM | POA: Diagnosis present

## 2016-10-26 DIAGNOSIS — N1330 Unspecified hydronephrosis: Secondary | ICD-10-CM | POA: Diagnosis not present

## 2016-10-26 DIAGNOSIS — Z23 Encounter for immunization: Secondary | ICD-10-CM | POA: Diagnosis not present

## 2016-12-27 ENCOUNTER — Other Ambulatory Visit (INDEPENDENT_AMBULATORY_CARE_PROVIDER_SITE_OTHER): Payer: 59

## 2016-12-27 DIAGNOSIS — K754 Autoimmune hepatitis: Secondary | ICD-10-CM

## 2016-12-27 LAB — HEPATIC FUNCTION PANEL
ALBUMIN: 4.6 g/dL (ref 3.5–5.2)
ALK PHOS: 71 U/L (ref 39–117)
ALT: 8 U/L (ref 0–35)
AST: 15 U/L (ref 0–37)
BILIRUBIN DIRECT: 0.1 mg/dL (ref 0.0–0.3)
Total Bilirubin: 0.7 mg/dL (ref 0.2–1.2)
Total Protein: 7.6 g/dL (ref 6.0–8.3)

## 2016-12-27 LAB — CBC WITH DIFFERENTIAL/PLATELET
BASOS ABS: 0 10*3/uL (ref 0.0–0.1)
Basophils Relative: 0.3 % (ref 0.0–3.0)
EOS ABS: 0.1 10*3/uL (ref 0.0–0.7)
Eosinophils Relative: 3.2 % (ref 0.0–5.0)
HCT: 40.1 % (ref 36.0–46.0)
Hemoglobin: 13.6 g/dL (ref 12.0–15.0)
LYMPHS ABS: 1.2 10*3/uL (ref 0.7–4.0)
Lymphocytes Relative: 27.7 % (ref 12.0–46.0)
MCHC: 34 g/dL (ref 30.0–36.0)
MCV: 98.6 fl (ref 78.0–100.0)
Monocytes Absolute: 0.4 10*3/uL (ref 0.1–1.0)
Monocytes Relative: 9.2 % (ref 3.0–12.0)
NEUTROS ABS: 2.7 10*3/uL (ref 1.4–7.7)
NEUTROS PCT: 59.6 % (ref 43.0–77.0)
PLATELETS: 203 10*3/uL (ref 150.0–400.0)
RBC: 4.07 Mil/uL (ref 3.87–5.11)
RDW: 13.8 % (ref 11.5–15.5)
WBC: 4.5 10*3/uL (ref 4.0–10.5)

## 2017-01-02 ENCOUNTER — Other Ambulatory Visit: Payer: Self-pay

## 2017-01-02 DIAGNOSIS — K754 Autoimmune hepatitis: Secondary | ICD-10-CM

## 2017-03-02 ENCOUNTER — Other Ambulatory Visit: Payer: Self-pay | Admitting: Internal Medicine

## 2017-04-04 ENCOUNTER — Other Ambulatory Visit (INDEPENDENT_AMBULATORY_CARE_PROVIDER_SITE_OTHER): Payer: 59

## 2017-04-04 DIAGNOSIS — K754 Autoimmune hepatitis: Secondary | ICD-10-CM | POA: Diagnosis not present

## 2017-04-04 LAB — CBC WITH DIFFERENTIAL/PLATELET
Basophils Absolute: 0 10*3/uL (ref 0.0–0.1)
Basophils Relative: 0.4 % (ref 0.0–3.0)
EOS PCT: 2.4 % (ref 0.0–5.0)
Eosinophils Absolute: 0.1 10*3/uL (ref 0.0–0.7)
HEMATOCRIT: 40.7 % (ref 36.0–46.0)
HEMOGLOBIN: 13.8 g/dL (ref 12.0–15.0)
LYMPHS ABS: 1 10*3/uL (ref 0.7–4.0)
Lymphocytes Relative: 16.1 % (ref 12.0–46.0)
MCHC: 33.9 g/dL (ref 30.0–36.0)
MCV: 99.9 fl (ref 78.0–100.0)
Monocytes Absolute: 0.5 10*3/uL (ref 0.1–1.0)
Monocytes Relative: 7.4 % (ref 3.0–12.0)
Neutro Abs: 4.5 10*3/uL (ref 1.4–7.7)
Neutrophils Relative %: 73.7 % (ref 43.0–77.0)
Platelets: 186 10*3/uL (ref 150.0–400.0)
RBC: 4.07 Mil/uL (ref 3.87–5.11)
RDW: 13.4 % (ref 11.5–15.5)
WBC: 6.1 10*3/uL (ref 4.0–10.5)

## 2017-04-04 LAB — HEPATIC FUNCTION PANEL
ALBUMIN: 4.4 g/dL (ref 3.5–5.2)
ALK PHOS: 88 U/L (ref 39–117)
ALT: 11 U/L (ref 0–35)
AST: 19 U/L (ref 0–37)
Bilirubin, Direct: 0.1 mg/dL (ref 0.0–0.3)
TOTAL PROTEIN: 7.5 g/dL (ref 6.0–8.3)
Total Bilirubin: 0.6 mg/dL (ref 0.2–1.2)

## 2017-04-08 ENCOUNTER — Other Ambulatory Visit: Payer: Self-pay

## 2017-04-08 DIAGNOSIS — K754 Autoimmune hepatitis: Secondary | ICD-10-CM

## 2017-07-19 ENCOUNTER — Other Ambulatory Visit: Payer: Self-pay | Admitting: Internal Medicine

## 2017-07-19 ENCOUNTER — Ambulatory Visit: Payer: 59 | Admitting: Family Medicine

## 2017-12-06 ENCOUNTER — Other Ambulatory Visit (INDEPENDENT_AMBULATORY_CARE_PROVIDER_SITE_OTHER): Payer: 59

## 2017-12-06 DIAGNOSIS — K754 Autoimmune hepatitis: Secondary | ICD-10-CM

## 2017-12-06 LAB — CBC WITH DIFFERENTIAL/PLATELET
BASOS PCT: 0.4 % (ref 0.0–3.0)
Basophils Absolute: 0 10*3/uL (ref 0.0–0.1)
EOS PCT: 5 % (ref 0.0–5.0)
Eosinophils Absolute: 0.2 10*3/uL (ref 0.0–0.7)
HCT: 40.4 % (ref 36.0–46.0)
HEMOGLOBIN: 13.8 g/dL (ref 12.0–15.0)
LYMPHS ABS: 1 10*3/uL (ref 0.7–4.0)
Lymphocytes Relative: 23.2 % (ref 12.0–46.0)
MCHC: 34.2 g/dL (ref 30.0–36.0)
MCV: 100.2 fl — AB (ref 78.0–100.0)
MONO ABS: 0.3 10*3/uL (ref 0.1–1.0)
MONOS PCT: 6.8 % (ref 3.0–12.0)
NEUTROS PCT: 64.6 % (ref 43.0–77.0)
Neutro Abs: 2.8 10*3/uL (ref 1.4–7.7)
Platelets: 166 10*3/uL (ref 150.0–400.0)
RBC: 4.03 Mil/uL (ref 3.87–5.11)
RDW: 12.9 % (ref 11.5–15.5)
WBC: 4.3 10*3/uL (ref 4.0–10.5)

## 2017-12-06 LAB — HEPATIC FUNCTION PANEL
ALK PHOS: 75 U/L (ref 39–117)
ALT: 11 U/L (ref 0–35)
AST: 21 U/L (ref 0–37)
Albumin: 4.5 g/dL (ref 3.5–5.2)
BILIRUBIN DIRECT: 0.1 mg/dL (ref 0.0–0.3)
BILIRUBIN TOTAL: 1 mg/dL (ref 0.2–1.2)
Total Protein: 7.2 g/dL (ref 6.0–8.3)

## 2017-12-11 ENCOUNTER — Other Ambulatory Visit: Payer: Self-pay

## 2017-12-11 DIAGNOSIS — K754 Autoimmune hepatitis: Secondary | ICD-10-CM

## 2017-12-29 ENCOUNTER — Other Ambulatory Visit: Payer: Self-pay | Admitting: Internal Medicine

## 2018-01-01 ENCOUNTER — Encounter: Payer: Self-pay | Admitting: Internal Medicine

## 2018-01-01 ENCOUNTER — Telehealth: Payer: Self-pay

## 2018-01-01 ENCOUNTER — Telehealth: Payer: Self-pay | Admitting: Internal Medicine

## 2018-01-01 ENCOUNTER — Encounter (INDEPENDENT_AMBULATORY_CARE_PROVIDER_SITE_OTHER): Payer: Self-pay

## 2018-01-01 ENCOUNTER — Ambulatory Visit: Payer: 59 | Admitting: Internal Medicine

## 2018-01-01 VITALS — BP 140/78 | HR 113 | Ht 66.0 in | Wt 128.0 lb

## 2018-01-01 DIAGNOSIS — Z1211 Encounter for screening for malignant neoplasm of colon: Secondary | ICD-10-CM

## 2018-01-01 DIAGNOSIS — Z8619 Personal history of other infectious and parasitic diseases: Secondary | ICD-10-CM

## 2018-01-01 DIAGNOSIS — K7460 Unspecified cirrhosis of liver: Secondary | ICD-10-CM | POA: Diagnosis not present

## 2018-01-01 DIAGNOSIS — K754 Autoimmune hepatitis: Secondary | ICD-10-CM | POA: Diagnosis not present

## 2018-01-01 NOTE — Progress Notes (Signed)
HISTORY OF PRESENT ILLNESS:  Rose Williams is a 57 y.o. female , [employer] employee, with autoimmune hepatitis diagnosed after presenting to the hospital September 2015 with markedly elevated liver tests and jaundice. Imaging studies revealed changes consistent with hepatic cirrhosis and mild splenic enlargement. She was initially treated with prednisone and subsequently converted to azathioprine. He is currently on azathioprine 100 mg daily. Liver tests have remained completely normal since December 2015. She was last seen in the office 09/25/2016. As a reminder, her case has been previously discussed with [facility] Esmond [facility]psAndrew [facility] at [facility]. She is aware. She did receive Twinrix vaccination series. She has not scheduled screening colonoscopy. She has had regular blood work. Last hepatic profile and CBC were obtained 12/06/2017. The laboratories were normal with the exception of slightly elevated MCV consistent with azathioprine therapy. Platelets 166,000. Her last abdominal ultrasound was performed October 2017. She was noted to have coursed increased echogenicity of the liver without focal mass. Spleen size was normal. She was referred back to her urologist regarding questionable renal abnormalities. She has had no interval medical problems. She tells me that she has had shingles in the past. She has not secured a PCP. She denies pruritus, edema, or other complaints.  REVIEW OF SYSTEMS:  All non-GI ROS negative entirely upon comprehensive review unless otherwise stated in the history of present illness  Past Medical History:  Diagnosis Date  . Autoimmune hepatitis (HCC)   . Cirrhosis (HCC)   . Hydronephrosis   . Jaundice   . Kidney stones   . Pyelonephritis     Past Surgical History:  Procedure Laterality Date  . LITHOTRIPSY     2008    Social History Rose Williams  reports that  has never smoked. she has never used smokeless tobacco. She reports that she does not drink alcohol or use  drugs.  family history includes Cancer in her mother; Cystic fibrosis in her child; Kidney Stones in her unknown relative.  Allergies  Allergen Reactions  . Dairy Aid [Lactase] Diarrhea and Nausea And Vomiting    Allergic to all dairy products  . Gluten Meal Diarrhea and Nausea And Vomiting  . Other Itching    Itching from melons, cantaloupe, eggplant  . Pseudoephedrine Other (See Comments)    tachycardia  . Whey Protein [Protein] Diarrhea and Nausea And Vomiting       PHYSICAL EXAMINATION: Vital signs: BP 140/78   Pulse (!) 113   Ht 5\' 6"  (1.676 m)   Wt 128 lb (58.1 kg)   SpO2 98%   BMI 20.66 kg/m   Constitutional: generally well-appearing, no acute distress Psychiatric: alert and oriented x3, cooperative Eyes: extraocular movements intact, anicteric, conjunctiva pink Mouth: oral pharynx moist, no lesions Neck: supple no lymphadenopathy Cardiovascular: heart regular rate and rhythm, no murmur Lungs: clear to auscultation bilaterally Abdomen: soft, nontender, nondistended, no obvious ascites, no peritoneal signs, normal bowel sounds, no organomegaly Rectal: Omitted Extremities: no clubbing, cyanosis, or lower extremity edema bilaterally Skin: no lesions on visible extremities Neuro: No focal deficits. Cranial nerves intact. No asterixis.   ASSESSMENT:  #1. Autoimmune hepatitis with radiographic evidence for cirrhosis. Clinically compensated. Asymptomatic on azathioprine 100 mg daily with normal LFTs. Platelets are stable #2. Status post Twinrix vaccination series #3. Prior history of shingles. Question whether she may benefit from new (not live) zoster vaccination #4. Colon cancer screening. Has not had. Overdue. We will send her a letter #5. Instructed to secure PCP. Has not done  PLAN:  #1. Continue azathioprine 100 mg daily #2. Continue CBC every 3 months and liver tests every 6 months unless directed otherwise #3. Schedule screening colonoscopy at her  convenience #4. She will check with ID specialist to see if there is any advantage and receiving the new zoster vaccination #5. Schedule liver ultrasound to screen for HCC #6. Advised to find PCP #7. Office follow-up one year. Sooner if needed

## 2018-01-01 NOTE — Telephone Encounter (Signed)
Recall colon letter sent to patient.

## 2018-01-01 NOTE — Telephone Encounter (Signed)
-----   Message from Chrystie NoseLinda R Hunt, RN sent at 01/01/2018  2:05 PM EST ----- Regarding: FW: Send a colonoscopy recall letter Please send the letter mentioned below. 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 She has not had screening colonoscopy but this has been recommended previously. It (getting a colonoscopy) did not come up in todays office visit. Please send her the standard letter stating that she is due for screening colonoscopy and she should feel free to arrange. Thanks

## 2018-01-01 NOTE — Telephone Encounter (Signed)
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 She has not had screening colonoscopy but this has been recommended previously. It (getting a colonoscopy) did not come up in todays office visit. Please send her the standard letter stating that she is due for screening colonoscopy and she should feel free to arrange. Thanks

## 2018-01-01 NOTE — Patient Instructions (Signed)
You have been scheduled for an abdominal ultrasound at [facility] (1st floor of hospital) on Monday, 03/03/18 at 8:30 am. Please arrive 15 minutes prior to your appointment for registration. Make certain not to have anything to eat or drink 6 hours prior to your appointment. Should you need to reschedule your appointment, please contact radiology at [phone]. This test typically takes about 30 minutes to perform.  You should continue having your bloodwork with us every 3-6 months. (last labs 12/06/17).  Please follow up with Dr Marina GoodellPerry in the office in 1 year.  If you are age 56 or older, your body mass index should be between 23-30. Your Body mass index is 20.66 kg/m. If this is out of the aforementioned range listed, please consider follow up with your Primary Care Provider.  If you are age 56 or younger, your body mass index should be between 19-25. Your Body mass index is 20.66 kg/m. If this is out of the aformentioned range listed, please consider follow up with your Primary Care Provider.

## 2018-01-01 NOTE — Telephone Encounter (Signed)
Recall letter sent to pt.  

## 2018-03-03 ENCOUNTER — Ambulatory Visit (HOSPITAL_COMMUNITY): Payer: 59

## 2018-03-03 ENCOUNTER — Ambulatory Visit (HOSPITAL_COMMUNITY)
Admission: RE | Admit: 2018-03-03 | Discharge: 2018-03-03 | Disposition: A | Discharge status: Home / Self Care | Payer: 59 | Source: Ambulatory Visit | Attending: Internal Medicine | Admitting: Internal Medicine

## 2018-03-03 DIAGNOSIS — N1330 Unspecified hydronephrosis: Secondary | ICD-10-CM | POA: Insufficient documentation

## 2018-03-03 DIAGNOSIS — K754 Autoimmune hepatitis: Secondary | ICD-10-CM

## 2018-03-03 DIAGNOSIS — R93422 Abnormal radiologic findings on diagnostic imaging of left kidney: Secondary | ICD-10-CM | POA: Insufficient documentation

## 2018-03-03 DIAGNOSIS — R93421 Abnormal radiologic findings on diagnostic imaging of right kidney: Secondary | ICD-10-CM | POA: Diagnosis not present

## 2018-03-27 ENCOUNTER — Other Ambulatory Visit (INDEPENDENT_AMBULATORY_CARE_PROVIDER_SITE_OTHER): Payer: 59

## 2018-03-27 DIAGNOSIS — K754 Autoimmune hepatitis: Secondary | ICD-10-CM | POA: Diagnosis not present

## 2018-03-27 LAB — CBC WITH DIFFERENTIAL/PLATELET
BASOS PCT: 0.5 % (ref 0.0–3.0)
Basophils Absolute: 0 10*3/uL (ref 0.0–0.1)
EOS PCT: 9.9 % — AB (ref 0.0–5.0)
Eosinophils Absolute: 0.6 10*3/uL (ref 0.0–0.7)
HEMATOCRIT: 41.2 % (ref 36.0–46.0)
Hemoglobin: 14.1 g/dL (ref 12.0–15.0)
Lymphocytes Relative: 24.9 % (ref 12.0–46.0)
Lymphs Abs: 1.5 10*3/uL (ref 0.7–4.0)
MCHC: 34.2 g/dL (ref 30.0–36.0)
MCV: 99.5 fl (ref 78.0–100.0)
MONO ABS: 0.5 10*3/uL (ref 0.1–1.0)
Monocytes Relative: 9.1 % (ref 3.0–12.0)
Neutro Abs: 3.3 10*3/uL (ref 1.4–7.7)
Neutrophils Relative %: 55.6 % (ref 43.0–77.0)
Platelets: 169 10*3/uL (ref 150.0–400.0)
RBC: 4.14 Mil/uL (ref 3.87–5.11)
RDW: 12.9 % (ref 11.5–15.5)
WBC: 5.9 10*3/uL (ref 4.0–10.5)

## 2018-03-28 ENCOUNTER — Other Ambulatory Visit: Payer: Self-pay

## 2018-03-28 DIAGNOSIS — K754 Autoimmune hepatitis: Secondary | ICD-10-CM

## 2018-05-23 ENCOUNTER — Other Ambulatory Visit: Payer: Self-pay | Admitting: Internal Medicine

## 2018-07-10 ENCOUNTER — Other Ambulatory Visit (INDEPENDENT_AMBULATORY_CARE_PROVIDER_SITE_OTHER): Payer: 59

## 2018-07-10 DIAGNOSIS — K754 Autoimmune hepatitis: Secondary | ICD-10-CM | POA: Diagnosis not present

## 2018-07-10 LAB — CBC WITH DIFFERENTIAL/PLATELET
BASOS ABS: 0 10*3/uL (ref 0.0–0.1)
Basophils Relative: 0.5 % (ref 0.0–3.0)
Eosinophils Absolute: 0.2 10*3/uL (ref 0.0–0.7)
Eosinophils Relative: 3.7 % (ref 0.0–5.0)
HCT: 41.6 % (ref 36.0–46.0)
HEMOGLOBIN: 13.9 g/dL (ref 12.0–15.0)
Lymphocytes Relative: 21.4 % (ref 12.0–46.0)
Lymphs Abs: 1.1 10*3/uL (ref 0.7–4.0)
MCHC: 33.3 g/dL (ref 30.0–36.0)
MCV: 99.6 fl (ref 78.0–100.0)
Monocytes Absolute: 0.4 10*3/uL (ref 0.1–1.0)
Monocytes Relative: 8.8 % (ref 3.0–12.0)
Neutro Abs: 3.3 10*3/uL (ref 1.4–7.7)
Neutrophils Relative %: 65.6 % (ref 43.0–77.0)
Platelets: 184 10*3/uL (ref 150.0–400.0)
RBC: 4.18 Mil/uL (ref 3.87–5.11)
RDW: 13.2 % (ref 11.5–15.5)
WBC: 5.1 10*3/uL (ref 4.0–10.5)

## 2018-07-11 ENCOUNTER — Other Ambulatory Visit: Payer: Self-pay

## 2018-07-11 DIAGNOSIS — K754 Autoimmune hepatitis: Secondary | ICD-10-CM

## 2018-10-16 ENCOUNTER — Other Ambulatory Visit (INDEPENDENT_AMBULATORY_CARE_PROVIDER_SITE_OTHER): Payer: 59

## 2018-10-16 DIAGNOSIS — K754 Autoimmune hepatitis: Secondary | ICD-10-CM

## 2018-10-16 LAB — HEPATIC FUNCTION PANEL
ALK PHOS: 75 U/L (ref 39–117)
ALT: 11 U/L (ref 0–35)
AST: 19 U/L (ref 0–37)
Albumin: 4.7 g/dL (ref 3.5–5.2)
BILIRUBIN TOTAL: 0.6 mg/dL (ref 0.2–1.2)
Bilirubin, Direct: 0.1 mg/dL (ref 0.0–0.3)
Total Protein: 7.7 g/dL (ref 6.0–8.3)

## 2018-10-16 LAB — CBC WITH DIFFERENTIAL/PLATELET
BASOS PCT: 0.5 % (ref 0.0–3.0)
Basophils Absolute: 0 10*3/uL (ref 0.0–0.1)
EOS PCT: 3.3 % (ref 0.0–5.0)
Eosinophils Absolute: 0.2 10*3/uL (ref 0.0–0.7)
HCT: 42.4 % (ref 36.0–46.0)
Hemoglobin: 14.2 g/dL (ref 12.0–15.0)
Lymphocytes Relative: 20 % (ref 12.0–46.0)
Lymphs Abs: 1 10*3/uL (ref 0.7–4.0)
MCHC: 33.5 g/dL (ref 30.0–36.0)
MCV: 99.2 fl (ref 78.0–100.0)
MONO ABS: 0.4 10*3/uL (ref 0.1–1.0)
MONOS PCT: 7.9 % (ref 3.0–12.0)
Neutro Abs: 3.6 10*3/uL (ref 1.4–7.7)
Neutrophils Relative %: 68.3 % (ref 43.0–77.0)
Platelets: 172 10*3/uL (ref 150.0–400.0)
RBC: 4.27 Mil/uL (ref 3.87–5.11)
RDW: 13.1 % (ref 11.5–15.5)
WBC: 5.2 10*3/uL (ref 4.0–10.5)

## 2018-10-17 ENCOUNTER — Other Ambulatory Visit: Payer: Self-pay

## 2018-10-17 DIAGNOSIS — K754 Autoimmune hepatitis: Secondary | ICD-10-CM

## 2018-11-18 ENCOUNTER — Other Ambulatory Visit: Payer: Self-pay | Admitting: Internal Medicine

## 2019-01-30 ENCOUNTER — Other Ambulatory Visit (INDEPENDENT_AMBULATORY_CARE_PROVIDER_SITE_OTHER): Payer: 59

## 2019-01-30 DIAGNOSIS — K754 Autoimmune hepatitis: Secondary | ICD-10-CM | POA: Diagnosis not present

## 2019-01-30 LAB — CBC WITH DIFFERENTIAL/PLATELET
BASOS PCT: 0.3 % (ref 0.0–3.0)
Basophils Absolute: 0 10*3/uL (ref 0.0–0.1)
EOS ABS: 0.4 10*3/uL (ref 0.0–0.7)
Eosinophils Relative: 7.4 % — ABNORMAL HIGH (ref 0.0–5.0)
HEMATOCRIT: 41.3 % (ref 36.0–46.0)
Hemoglobin: 14.1 g/dL (ref 12.0–15.0)
Lymphocytes Relative: 18.7 % (ref 12.0–46.0)
Lymphs Abs: 1 10*3/uL (ref 0.7–4.0)
MCHC: 34.1 g/dL (ref 30.0–36.0)
MCV: 99.5 fl (ref 78.0–100.0)
Monocytes Absolute: 0.4 10*3/uL (ref 0.1–1.0)
Monocytes Relative: 7.8 % (ref 3.0–12.0)
Neutro Abs: 3.6 10*3/uL (ref 1.4–7.7)
Neutrophils Relative %: 65.8 % (ref 43.0–77.0)
PLATELETS: 206 10*3/uL (ref 150.0–400.0)
RBC: 4.16 Mil/uL (ref 3.87–5.11)
RDW: 13 % (ref 11.5–15.5)
WBC: 5.5 10*3/uL (ref 4.0–10.5)

## 2019-02-03 ENCOUNTER — Other Ambulatory Visit: Payer: Self-pay

## 2019-02-03 DIAGNOSIS — K754 Autoimmune hepatitis: Secondary | ICD-10-CM

## 2019-02-05 ENCOUNTER — Ambulatory Visit: Payer: 59 | Admitting: Internal Medicine

## 2019-02-20 ENCOUNTER — Ambulatory Visit: Payer: 59 | Admitting: Internal Medicine

## 2019-04-03 ENCOUNTER — Ambulatory Visit: Payer: 59 | Admitting: Internal Medicine

## 2019-04-06 ENCOUNTER — Other Ambulatory Visit: Payer: Self-pay

## 2019-04-06 MED ORDER — AZATHIOPRINE 50 MG PO TABS: 100.0000 mg | ORAL_TABLET | Freq: Every day | ORAL | 3 refills | Status: DC

## 2019-04-13 ENCOUNTER — Telehealth: Payer: Self-pay

## 2019-04-13 NOTE — Telephone Encounter (Signed)
-----   Message from Chrystie Nose, RN sent at 10/17/2018 10:28 AM EDT ----- Regarding: LFT's Pt needs LFT's order needs to be entered

## 2019-04-13 NOTE — Telephone Encounter (Signed)
Spoke with pt and she plans to come and have LFT's drawn, orders in epic.

## 2019-04-24 ENCOUNTER — Other Ambulatory Visit (INDEPENDENT_AMBULATORY_CARE_PROVIDER_SITE_OTHER): Payer: 59

## 2019-04-24 DIAGNOSIS — K754 Autoimmune hepatitis: Secondary | ICD-10-CM | POA: Diagnosis not present

## 2019-04-24 LAB — CBC WITH DIFFERENTIAL/PLATELET
Basophils Absolute: 0 10*3/uL (ref 0.0–0.1)
Basophils Relative: 1 % (ref 0.0–3.0)
Eosinophils Absolute: 0.4 10*3/uL (ref 0.0–0.7)
Eosinophils Relative: 8.8 % — ABNORMAL HIGH (ref 0.0–5.0)
HCT: 40.7 % (ref 36.0–46.0)
Hemoglobin: 14 g/dL (ref 12.0–15.0)
Lymphocytes Relative: 20.8 % (ref 12.0–46.0)
Lymphs Abs: 1 10*3/uL (ref 0.7–4.0)
MCHC: 34.5 g/dL (ref 30.0–36.0)
MCV: 98.4 fl (ref 78.0–100.0)
Monocytes Absolute: 0.4 10*3/uL (ref 0.1–1.0)
Monocytes Relative: 8.2 % (ref 3.0–12.0)
Neutro Abs: 3.1 10*3/uL (ref 1.4–7.7)
Neutrophils Relative %: 61.2 % (ref 43.0–77.0)
Platelets: 185 10*3/uL (ref 150.0–400.0)
RBC: 4.14 Mil/uL (ref 3.87–5.11)
RDW: 13.1 % (ref 11.5–15.5)
WBC: 5 10*3/uL (ref 4.0–10.5)

## 2019-04-24 LAB — HEPATIC FUNCTION PANEL
ALT: 13 U/L (ref 0–35)
AST: 18 U/L (ref 0–37)
Albumin: 4.4 g/dL (ref 3.5–5.2)
Alkaline Phosphatase: 76 U/L (ref 39–117)
Bilirubin, Direct: 0.2 mg/dL (ref 0.0–0.3)
Total Bilirubin: 0.7 mg/dL (ref 0.2–1.2)
Total Protein: 7.3 g/dL (ref 6.0–8.3)

## 2019-04-27 ENCOUNTER — Other Ambulatory Visit: Payer: Self-pay

## 2019-04-27 DIAGNOSIS — K754 Autoimmune hepatitis: Secondary | ICD-10-CM

## 2019-05-04 ENCOUNTER — Other Ambulatory Visit: Payer: Self-pay

## 2019-05-04 DIAGNOSIS — K754 Autoimmune hepatitis: Secondary | ICD-10-CM

## 2019-05-18 ENCOUNTER — Telehealth: Payer: Self-pay

## 2019-05-18 NOTE — Telephone Encounter (Signed)
Completed screening for [facility] appointment scheduled tomorrow, 5/19

## 2019-05-19 ENCOUNTER — Telehealth: Payer: Self-pay

## 2019-05-19 ENCOUNTER — Encounter: Payer: Self-pay | Admitting: Internal Medicine

## 2019-05-19 ENCOUNTER — Other Ambulatory Visit: Payer: Self-pay

## 2019-05-19 ENCOUNTER — Ambulatory Visit (INDEPENDENT_AMBULATORY_CARE_PROVIDER_SITE_OTHER): Payer: 59 | Admitting: Internal Medicine

## 2019-05-19 DIAGNOSIS — K7460 Unspecified cirrhosis of liver: Secondary | ICD-10-CM | POA: Diagnosis not present

## 2019-05-19 DIAGNOSIS — D899 Disorder involving the immune mechanism, unspecified: Secondary | ICD-10-CM | POA: Diagnosis not present

## 2019-05-19 DIAGNOSIS — K754 Autoimmune hepatitis: Secondary | ICD-10-CM

## 2019-05-19 DIAGNOSIS — Z1211 Encounter for screening for malignant neoplasm of colon: Secondary | ICD-10-CM

## 2019-05-19 DIAGNOSIS — D849 Immunodeficiency, unspecified: Secondary | ICD-10-CM

## 2019-05-19 NOTE — Telephone Encounter (Signed)
Spoke with patient to schedule liver ultrasound per Dr. Marina Goodell.  Due to coronovirus concerns, she would like to wait to schedule this.  I told her I will call her in a few weeks to possibly schedule this.  Patient agreed.

## 2019-05-19 NOTE — Patient Instructions (Signed)
1.  Continue azathioprine 100 mg daily.  We discussed the risks of the medication including issues with immunosuppression as relates to infection.  REFILL MEDICATION FOR 1 YEAR 2.  Continue CBC every 3 months unless otherwise directed 3.  Continue liver test every 6 months unless otherwise directed 4.  SCHEDULE LIVER ULTRASOUND "cirrhosis, screen for HCC".  Please contact patient regarding this examination.  I did not mention it to her during her office evaluation, but she is due. 5.  Colon cancer screening.  I discussed with her stool based testing as well as optical colonoscopy.  She believes she will proceed with optical colonoscopy later this year. 6.  Routine office follow-up 1 year regarding liver disease.  Sooner if needed  As we discussed, I'll call you in a few weeks to see where we stand with scheduling your ultrasound.    :)

## 2019-05-19 NOTE — Progress Notes (Signed)
HISTORY OF PRESENT ILLNESS:  Rose Williams is a 58 y.o. female, Rose Williams and [name] employee, with autoimmune hepatitis diagnosed after presenting to the hospital September 2015 with markedly elevated liver tests and jaundice.  Imaging studies revealed changes consistent with hepatic cirrhosis with mild splenic enlargement.  She was initially treated with prednisone and subsequently converted to azathioprine.  She is currently on azathioprine 100 mg daily.  Liver tests have remained completely normal since December 2015.  She does undergo routine CBCs which have remained in the acceptable range.  Case previously discussed with [facility] at [facility].  She did complete Twinrix vaccination series.  She was last seen in this office January 01, 2018.  She tells me that she has done well since without any medical issues.  GI review of systems is negative.  She has yet to complete colon cancer screening but is hopeful for screening colonoscopy later this year.  Review of blood work from January 30, 2021 is normal CBC with white blood cell count 5.5 and MCV 99.5.  Last set of liver tests October 2019 were normal.  Last ultrasound March 03, 2018 was essentially normal including the liver.  REVIEW OF SYSTEMS:  All non-GI ROS negative unless otherwise stated in the HPI   Past Medical History:  Diagnosis Date  . Autoimmune hepatitis (HCC)   . Cirrhosis (HCC)   . Hydronephrosis   . Jaundice   . Kidney stones   . Pyelonephritis     Past Surgical History:  Procedure Laterality Date  . LITHOTRIPSY     2008    Social History Rose Williams  reports that she has never smoked. She has never used smokeless tobacco. She reports that she does not drink alcohol or use drugs.  family history includes Cancer in her mother; Cystic fibrosis in her child; Kidney Stones in an other family member.  Allergies  Allergen Reactions  . Dairy Aid [Lactase] Diarrhea and Nausea And Vomiting    Allergic to all dairy  products  . Gluten Meal Diarrhea and Nausea And Vomiting  . Other Itching    Itching from melons, cantaloupe, eggplant  . Pseudoephedrine Other (See Comments)    tachycardia  . Whey Protein [Protein] Diarrhea and Nausea And Vomiting       PHYSICAL EXAMINATION: Patient looks well.  She is alert and oriented.  Cooperative. No additional physical exam components with tele-health visit  ASSESSMENT:  1.  Autoimmune hepatitis with radiographic evidence with cirrhosis previously.  Most recent ultrasound 1 year ago unremarkable.  She remains asymptomatic on azathioprine 100 mg daily with normal liver tests.  Normal platelets.  Status post Twinrix vaccination series. 2.  Prior history of shingles 3.  Colon cancer screening.  Has not completed.  Overdue.  She is aware. 4.  Yet to secure PCP as previously instructed  PLAN:  1.  Continue azathioprine 100 mg daily.  We discussed the risks of the medication including issues with immunosuppression as relates to infection.  REFILL MEDICATION FOR 1 YEAR 2.  Continue CBC every 3 months unless otherwise directed 3.  Continue liver test every 6 months unless otherwise directed 4.  SCHEDULE LIVER ULTRASOUND "cirrhosis, screen for HCC".  Please contact patient regarding this examination.  I did not mention it to her during her office evaluation, but she is due. 5.  Colon cancer screening.  I discussed with her stool based testing as well as optical colonoscopy.  She believes she will proceed with optical colonoscopy  later this year. 6.  Routine office follow-up 1 year regarding liver disease.  Sooner if needed This WebEx audiovisual telehealth medicine visit was scheduled by the patient and consented for by the patient was in her home while I was in my office during the encounter.  She understands her may be an associated professional charge for this service.

## 2019-09-02 ENCOUNTER — Other Ambulatory Visit: Payer: Self-pay

## 2019-09-02 MED ORDER — AZATHIOPRINE 50 MG PO TABS: 100.0000 mg | ORAL_TABLET | Freq: Every day | ORAL | 3 refills | Status: DC

## 2019-10-22 ENCOUNTER — Telehealth: Payer: Self-pay

## 2019-10-22 NOTE — Telephone Encounter (Signed)
-----   Message from Hughie Closs, RN sent at 04/27/2019  9:33 AM EDT ----- Call and ask pt. To come in for lab work. CBC and Hep. Fx  Panel. CBC already put in Epic. Need to put in Hepatic fx Panel

## 2019-10-22 NOTE — Telephone Encounter (Signed)
Called patient and reminded her to please come in for 6 month F/U of CBC & LFTs. She agreed

## 2019-11-09 ENCOUNTER — Other Ambulatory Visit (INDEPENDENT_AMBULATORY_CARE_PROVIDER_SITE_OTHER): Payer: 59

## 2019-11-09 ENCOUNTER — Other Ambulatory Visit: Payer: Self-pay

## 2019-11-09 DIAGNOSIS — K754 Autoimmune hepatitis: Secondary | ICD-10-CM | POA: Diagnosis not present

## 2019-11-09 LAB — CBC WITH DIFFERENTIAL/PLATELET
Basophils Absolute: 0 10*3/uL (ref 0.0–0.1)
Basophils Relative: 0.4 % (ref 0.0–3.0)
Eosinophils Absolute: 0.6 10*3/uL (ref 0.0–0.7)
Eosinophils Relative: 10.5 % — ABNORMAL HIGH (ref 0.0–5.0)
HCT: 40.6 % (ref 36.0–46.0)
Hemoglobin: 13.7 g/dL (ref 12.0–15.0)
Lymphocytes Relative: 19.1 % (ref 12.0–46.0)
Lymphs Abs: 1 10*3/uL (ref 0.7–4.0)
MCHC: 33.9 g/dL (ref 30.0–36.0)
MCV: 100 fl (ref 78.0–100.0)
Monocytes Absolute: 0.4 10*3/uL (ref 0.1–1.0)
Monocytes Relative: 8 % (ref 3.0–12.0)
Neutro Abs: 3.3 10*3/uL (ref 1.4–7.7)
Neutrophils Relative %: 62 % (ref 43.0–77.0)
Platelets: 181 10*3/uL (ref 150.0–400.0)
RBC: 4.06 Mil/uL (ref 3.87–5.11)
RDW: 13 % (ref 11.5–15.5)
WBC: 5.4 10*3/uL (ref 4.0–10.5)

## 2019-11-09 LAB — HEPATIC FUNCTION PANEL
ALT: 18 U/L (ref 0–35)
AST: 22 U/L (ref 0–37)
Albumin: 4.3 g/dL (ref 3.5–5.2)
Alkaline Phosphatase: 107 U/L (ref 39–117)
Bilirubin, Direct: 0.1 mg/dL (ref 0.0–0.3)
Total Bilirubin: 0.6 mg/dL (ref 0.2–1.2)
Total Protein: 7.3 g/dL (ref 6.0–8.3)

## 2020-05-11 ENCOUNTER — Ambulatory Visit: Payer: Self-pay | Attending: Internal Medicine

## 2020-05-11 ENCOUNTER — Ambulatory Visit: Payer: 59 | Attending: Internal Medicine

## 2020-05-11 DIAGNOSIS — Z23 Encounter for immunization: Secondary | ICD-10-CM

## 2020-05-11 NOTE — Progress Notes (Signed)
   Covid-19 Vaccination Clinic  Name:  ANISHKA BUSHARD    MRN: [id number] DOB: 07/30/1961  05/11/2020  Ms. Seedorf was observed post Covid-19 immunization for 15 minutes without incident. She was provided with Vaccine Information Sheet and instruction to access the V-Safe system.   Ms. Hargett was instructed to call 911 with any severe reactions post vaccine: Marland Kitchen Difficulty breathing  . Swelling of face and throat  . A fast heartbeat  . A bad rash all over body  . Dizziness and weakness   Immunizations Administered    Name Date Dose VIS Date Route   Pfizer COVID-19 Vaccine 05/11/2020  8:35 AM 0.3 mL 02/24/2019 Intramuscular   Manufacturer: [employer], [employer]   Lot: [id number]   NDC: [address]-7

## 2020-05-12 ENCOUNTER — Telehealth: Payer: Self-pay

## 2020-05-12 NOTE — Telephone Encounter (Signed)
-----   Message from Hilarie Fredrickson, MD sent at 05/10/2020  8:41 AM EDT ----- Regarding: RE: Labs Yes. Please ----- Message ----- From: Chrystie Nose, RN Sent: 05/08/2020 To: Hilarie Fredrickson, MD Subject: Labs                                           Pt needs labs, orders in epic

## 2020-06-13 ENCOUNTER — Other Ambulatory Visit (INDEPENDENT_AMBULATORY_CARE_PROVIDER_SITE_OTHER): Payer: 59

## 2020-06-13 DIAGNOSIS — K754 Autoimmune hepatitis: Secondary | ICD-10-CM

## 2020-06-13 LAB — CBC WITH DIFFERENTIAL/PLATELET
Basophils Absolute: 0 10*3/uL (ref 0.0–0.1)
Basophils Relative: 0.3 % (ref 0.0–3.0)
Eosinophils Absolute: 0.2 10*3/uL (ref 0.0–0.7)
Eosinophils Relative: 4.5 % (ref 0.0–5.0)
HCT: 41.4 % (ref 36.0–46.0)
Hemoglobin: 14.1 g/dL (ref 12.0–15.0)
Lymphocytes Relative: 20.4 % (ref 12.0–46.0)
Lymphs Abs: 0.9 10*3/uL (ref 0.7–4.0)
MCHC: 34.1 g/dL (ref 30.0–36.0)
MCV: 99.5 fl (ref 78.0–100.0)
Monocytes Absolute: 0.4 10*3/uL (ref 0.1–1.0)
Monocytes Relative: 9 % (ref 3.0–12.0)
Neutro Abs: 3 10*3/uL (ref 1.4–7.7)
Neutrophils Relative %: 65.8 % (ref 43.0–77.0)
Platelets: 179 10*3/uL (ref 150.0–400.0)
RBC: 4.16 Mil/uL (ref 3.87–5.11)
RDW: 13.2 % (ref 11.5–15.5)
WBC: 4.6 10*3/uL (ref 4.0–10.5)

## 2020-06-13 LAB — HEPATIC FUNCTION PANEL
ALT: 11 U/L (ref 0–35)
AST: 19 U/L (ref 0–37)
Albumin: 4.5 g/dL (ref 3.5–5.2)
Alkaline Phosphatase: 71 U/L (ref 39–117)
Bilirubin, Direct: 0.2 mg/dL (ref 0.0–0.3)
Total Bilirubin: 0.8 mg/dL (ref 0.2–1.2)
Total Protein: 7.5 g/dL (ref 6.0–8.3)

## 2020-06-14 ENCOUNTER — Other Ambulatory Visit: Payer: Self-pay

## 2020-06-14 DIAGNOSIS — K754 Autoimmune hepatitis: Secondary | ICD-10-CM

## 2020-07-27 ENCOUNTER — Ambulatory Visit: Payer: 59 | Admitting: Internal Medicine

## 2020-09-07 ENCOUNTER — Ambulatory Visit (INDEPENDENT_AMBULATORY_CARE_PROVIDER_SITE_OTHER): Payer: 59 | Admitting: Internal Medicine

## 2020-09-07 ENCOUNTER — Encounter: Payer: Self-pay | Admitting: Internal Medicine

## 2020-09-07 VITALS — HR 120 | Ht 66.0 in | Wt 120.6 lb

## 2020-09-07 DIAGNOSIS — K754 Autoimmune hepatitis: Secondary | ICD-10-CM

## 2020-09-07 DIAGNOSIS — Z1211 Encounter for screening for malignant neoplasm of colon: Secondary | ICD-10-CM

## 2020-09-07 DIAGNOSIS — K7460 Unspecified cirrhosis of liver: Secondary | ICD-10-CM | POA: Diagnosis not present

## 2020-09-07 MED ORDER — AZATHIOPRINE 50 MG PO TABS: 100.0000 mg | ORAL_TABLET | Freq: Every day | ORAL | 3 refills | Status: DC

## 2020-09-07 NOTE — Progress Notes (Signed)
HISTORY OF PRESENT ILLNESS:  Rose Williams is a 59 y.o. female , [employer] and [employer] employee, with autoimmune hepatitis diagnosed after presenting to the hospital September 2015 with markedly elevated liver tests and jaundice.  Imaging studies revealed changes consistent with hepatic cirrhosis with mild splenic enlargement.  She was initially treated with prednisone and subsequently converted to azathioprine.  She is currently on azathioprine 100 mg daily.  Liver tests have remained completely normal since December 2015.  She does undergo routine CBCs which have remained in the acceptable range.  Case previously discussed with [facility] at [facility].  She did complete Twinrix vaccination series.  She has also completed her Covid vaccination series. She was last seen virtually May 19, 2019.  See that dictation.  She tells me that she has done well since without any medical issues.  GI review of systems is negative.  Her last blood work from June 13, 2020 revealed normal CBC and normal liver tests.  Her last screening ultrasound was performed March 2019.  Liver was said to be normal in appearance. She has yet to complete colon cancer screening.  She is accompanied today by her husband.  They recently vacationed in [place].  REVIEW OF SYSTEMS:  All non-GI ROS negative unless otherwise stated in HPI.  Past Medical History:  Diagnosis Date  . Autoimmune hepatitis (HCC)   . Cirrhosis (HCC)   . Hydronephrosis   . Jaundice   . Kidney stones   . Pyelonephritis     Past Surgical History:  Procedure Laterality Date  . LITHOTRIPSY     2008    Social History Rose Williams  reports that she has never smoked. She has never used smokeless tobacco. She reports that she does not drink alcohol and does not use drugs.  family history includes Cancer in her mother; Cystic fibrosis in her child; Kidney Stones in an other family member.  Allergies  Allergen Reactions  . Dairy Aid [Lactase] Diarrhea  and Nausea And Vomiting    Allergic to all dairy products  . Gluten Meal Diarrhea and Nausea And Vomiting  . Other Itching    Itching from melons, cantaloupe, eggplant  . Pseudoephedrine Other (See Comments)    tachycardia  . Whey Protein [Protein] Diarrhea and Nausea And Vomiting       PHYSICAL EXAMINATION: Vital signs: Pulse (!) 120   Ht 5\' 6"  (1.676 m) Comment: height measured without shoes  Wt 120 lb 9 oz (54.7 kg)   BMI 19.46 kg/m   Constitutional: generally well-appearing, no acute distress Psychiatric: alert and oriented x3, cooperative Eyes: extraocular movements intact, anicteric, conjunctiva pink Mouth: Mask Neck: supple no lymphadenopathy Cardiovascular: heart regular rate and rhythm, no murmur Lungs: clear to auscultation bilaterally Abdomen: soft, nontender, nondistended, no obvious ascites, no peritoneal signs, normal bowel sounds, no organomegaly Rectal: Omitted Extremities: no clubbing, cyanosis, or lower extremity edema bilaterally Skin: no lesions on visible extremities Neuro: No focal deficits.  Cranial nerves intact  ASSESSMENT:  1.  Autoimmune hepatitis with radiographic evidence of cirrhosis, previously.  Most recent ultrasound 2 years ago was unremarkable.  She remains asymptomatic on azathioprine 100 mg daily with normal liver tests and CBC. 2.  History of shingles 3.  Colon cancer screening.  Multiple strategies discussed   PLAN:  1.  Continue azathioprine 100 mg daily 2.  Refill azathioprine prescription.  Medication risks reviewed 3.  Continue with blood work every 6 months 4.  Schedule surveillance ultrasound of the liver.  Contact patient with the results when available. 5.  The patient is not interested in optical colonoscopy at this time but agrees to Cologuard stool testing as a colon cancer screening option.  She understands that if the test is positive, it would elicit the need for optical colonoscopy.  We will contact patient with the  results when available. 6.  Routine office follow-up 1 year.  Contact the office in the interim for any questions or problems.  She agrees

## 2020-09-07 NOTE — Patient Instructions (Signed)
We have sent the following medications to your pharmacy for you to pick up at your convenience:  Imuran   You have been scheduled for an abdominal ultrasound at [facility] (1st floor of hospital) on 10/24/2020 at 8:00am. Please arrive 15 minutes prior to your appointment for registration. Make certain not to have anything to eat or drink after midnight prior to your appointment. Should you need to reschedule your appointment, please contact radiology at [phone]. This test typically takes about 30 minutes to perform.  Your provider has ordered Cologuard testing as an option for colon cancer screening. This is performed by [employer] and may be out of network with your insurance. PRIOR to completing the test, it is YOUR responsibility to contact your insurance about covered benefits for this test. Your out of pocket expense could be anywhere from $0.00 to $649.00.   When you call to check coverage with your insurer, please provide the following information:   -The ONLY provider of Cologuard is Optician, dispensing  - CPT code for Cologuard is [phone].  [facility] NPI # [id number]  -[employer] Tax ID # [id number]   We have already sent your demographic and insurance information to [employer] (phone number [phone]) and they should contact you within the next week regarding your test. If you have not heard from them within the next week, please call our office at [phone].  Please follow up in one year

## 2020-10-24 ENCOUNTER — Ambulatory Visit (HOSPITAL_COMMUNITY): Payer: 59

## 2020-11-02 ENCOUNTER — Other Ambulatory Visit: Payer: Self-pay | Admitting: Internal Medicine

## 2020-11-10 ENCOUNTER — Ambulatory Visit (HOSPITAL_COMMUNITY)
Admission: RE | Admit: 2020-11-10 | Discharge: 2020-11-10 | Disposition: A | Discharge status: Home / Self Care | Payer: 59 | Source: Ambulatory Visit | Attending: Internal Medicine | Admitting: Internal Medicine

## 2020-11-10 ENCOUNTER — Other Ambulatory Visit: Payer: Self-pay

## 2020-11-10 DIAGNOSIS — Z1211 Encounter for screening for malignant neoplasm of colon: Secondary | ICD-10-CM | POA: Diagnosis present

## 2020-11-10 DIAGNOSIS — K7460 Unspecified cirrhosis of liver: Secondary | ICD-10-CM | POA: Diagnosis present

## 2020-11-10 DIAGNOSIS — K754 Autoimmune hepatitis: Secondary | ICD-10-CM

## 2020-12-14 ENCOUNTER — Telehealth: Payer: Self-pay

## 2020-12-14 NOTE — Telephone Encounter (Signed)
Pt knows it is time to have labs drawn, orders in epic.

## 2020-12-14 NOTE — Telephone Encounter (Signed)
-----   Message from Missy Sabins, RN sent at 12/14/2020 10:37 AM EST -----  ----- Message ----- From: Missy Sabins, RN Sent: 12/14/2020  12:00 AM EST To: Missy Sabins, RN  Repeat CBC/Hepatic function panel

## 2020-12-20 ENCOUNTER — Other Ambulatory Visit (INDEPENDENT_AMBULATORY_CARE_PROVIDER_SITE_OTHER): Payer: 59

## 2020-12-20 DIAGNOSIS — K754 Autoimmune hepatitis: Secondary | ICD-10-CM

## 2020-12-20 LAB — CBC WITH DIFFERENTIAL/PLATELET
Basophils Absolute: 0 10*3/uL (ref 0.0–0.1)
Basophils Relative: 0.6 % (ref 0.0–3.0)
Eosinophils Absolute: 0.1 10*3/uL (ref 0.0–0.7)
Eosinophils Relative: 2.7 % (ref 0.0–5.0)
HCT: 41.6 % (ref 36.0–46.0)
Hemoglobin: 14.3 g/dL (ref 12.0–15.0)
Lymphocytes Relative: 19.8 % (ref 12.0–46.0)
Lymphs Abs: 1 10*3/uL (ref 0.7–4.0)
MCHC: 34.4 g/dL (ref 30.0–36.0)
MCV: 98 fl (ref 78.0–100.0)
Monocytes Absolute: 0.4 10*3/uL (ref 0.1–1.0)
Monocytes Relative: 7.9 % (ref 3.0–12.0)
Neutro Abs: 3.5 10*3/uL (ref 1.4–7.7)
Neutrophils Relative %: 69 % (ref 43.0–77.0)
Platelets: 172 10*3/uL (ref 150.0–400.0)
RBC: 4.25 Mil/uL (ref 3.87–5.11)
RDW: 13.2 % (ref 11.5–15.5)
WBC: 5.1 10*3/uL (ref 4.0–10.5)

## 2020-12-20 LAB — HEPATIC FUNCTION PANEL
ALT: 13 U/L (ref 0–35)
AST: 26 U/L (ref 0–37)
Albumin: 4.6 g/dL (ref 3.5–5.2)
Alkaline Phosphatase: 76 U/L (ref 39–117)
Bilirubin, Direct: 0.1 mg/dL (ref 0.0–0.3)
Total Bilirubin: 0.7 mg/dL (ref 0.2–1.2)
Total Protein: 8 g/dL (ref 6.0–8.3)

## 2020-12-21 ENCOUNTER — Other Ambulatory Visit: Payer: Self-pay

## 2020-12-21 DIAGNOSIS — K754 Autoimmune hepatitis: Secondary | ICD-10-CM

## 2021-07-13 ENCOUNTER — Other Ambulatory Visit (INDEPENDENT_AMBULATORY_CARE_PROVIDER_SITE_OTHER): Payer: 59

## 2021-07-13 DIAGNOSIS — K754 Autoimmune hepatitis: Secondary | ICD-10-CM

## 2021-07-13 LAB — HEPATIC FUNCTION PANEL
ALT: 9 U/L (ref 0–35)
AST: 17 U/L (ref 0–37)
Albumin: 4.6 g/dL (ref 3.5–5.2)
Alkaline Phosphatase: 72 U/L (ref 39–117)
Bilirubin, Direct: 0.2 mg/dL (ref 0.0–0.3)
Total Bilirubin: 0.9 mg/dL (ref 0.2–1.2)
Total Protein: 7.5 g/dL (ref 6.0–8.3)

## 2021-07-14 LAB — CBC WITH DIFFERENTIAL/PLATELET
Basophils Absolute: 0 10*3/uL (ref 0.0–0.1)
Basophils Relative: 0.6 % (ref 0.0–3.0)
Eosinophils Absolute: 0.1 10*3/uL (ref 0.0–0.7)
Eosinophils Relative: 2.4 % (ref 0.0–5.0)
HCT: 39.9 % (ref 36.0–46.0)
Hemoglobin: 13.8 g/dL (ref 12.0–15.0)
Lymphocytes Relative: 15.6 % (ref 12.0–46.0)
Lymphs Abs: 0.8 10*3/uL (ref 0.7–4.0)
MCHC: 34.7 g/dL (ref 30.0–36.0)
MCV: 97.5 fl (ref 78.0–100.0)
Monocytes Absolute: 0.4 10*3/uL (ref 0.1–1.0)
Monocytes Relative: 7.5 % (ref 3.0–12.0)
Neutro Abs: 3.9 10*3/uL (ref 1.4–7.7)
Neutrophils Relative %: 73.9 % (ref 43.0–77.0)
Platelets: 178 10*3/uL (ref 150.0–400.0)
RBC: 4.09 Mil/uL (ref 3.87–5.11)
RDW: 13.1 % (ref 11.5–15.5)
WBC: 5.3 10*3/uL (ref 4.0–10.5)

## 2021-11-10 ENCOUNTER — Other Ambulatory Visit: Payer: Self-pay

## 2021-11-10 DIAGNOSIS — K754 Autoimmune hepatitis: Secondary | ICD-10-CM

## 2021-11-10 DIAGNOSIS — K7460 Unspecified cirrhosis of liver: Secondary | ICD-10-CM

## 2021-11-14 ENCOUNTER — Other Ambulatory Visit: Payer: Self-pay | Admitting: Internal Medicine

## 2021-12-18 ENCOUNTER — Other Ambulatory Visit (INDEPENDENT_AMBULATORY_CARE_PROVIDER_SITE_OTHER): Payer: 59

## 2021-12-18 ENCOUNTER — Other Ambulatory Visit: Payer: Self-pay

## 2021-12-18 DIAGNOSIS — K754 Autoimmune hepatitis: Secondary | ICD-10-CM

## 2021-12-18 LAB — CBC WITH DIFFERENTIAL/PLATELET
Basophils Absolute: 0 10*3/uL (ref 0.0–0.1)
Basophils Relative: 0.5 % (ref 0.0–3.0)
Eosinophils Absolute: 0.2 10*3/uL (ref 0.0–0.7)
Eosinophils Relative: 3.6 % (ref 0.0–5.0)
HCT: 43.8 % (ref 36.0–46.0)
Hemoglobin: 14.6 g/dL (ref 12.0–15.0)
Lymphocytes Relative: 19.3 % (ref 12.0–46.0)
Lymphs Abs: 0.9 10*3/uL (ref 0.7–4.0)
MCHC: 33.3 g/dL (ref 30.0–36.0)
MCV: 98.7 fl (ref 78.0–100.0)
Monocytes Absolute: 0.4 10*3/uL (ref 0.1–1.0)
Monocytes Relative: 8.6 % (ref 3.0–12.0)
Neutro Abs: 3.1 10*3/uL (ref 1.4–7.7)
Neutrophils Relative %: 68 % (ref 43.0–77.0)
Platelets: 191 10*3/uL (ref 150.0–400.0)
RBC: 4.44 Mil/uL (ref 3.87–5.11)
RDW: 13.6 % (ref 11.5–15.5)
WBC: 4.6 10*3/uL (ref 4.0–10.5)

## 2021-12-19 ENCOUNTER — Other Ambulatory Visit: Payer: Self-pay

## 2021-12-19 LAB — HEPATIC FUNCTION PANEL
ALT: 12 U/L (ref 0–35)
AST: 21 U/L (ref 0–37)
Albumin: 4.6 g/dL (ref 3.5–5.2)
Alkaline Phosphatase: 72 U/L (ref 39–117)
Bilirubin, Direct: 0.1 mg/dL (ref 0.0–0.3)
Total Bilirubin: 0.8 mg/dL (ref 0.2–1.2)
Total Protein: 8 g/dL (ref 6.0–8.3)

## 2021-12-19 MED ORDER — AZATHIOPRINE 50 MG PO TABS
100.0000 mg | ORAL_TABLET | Freq: Every day | ORAL | 3 refills | Status: DC
Start: 2021-12-19 — End: 2023-03-13
  Filled 2022-03-13: qty 180, 90d supply, fill #0
  Filled 2022-07-24: qty 180, 90d supply, fill #1
  Filled 2022-11-14: qty 180, 90d supply, fill #2

## 2022-01-04 ENCOUNTER — Other Ambulatory Visit: Payer: Self-pay

## 2022-01-04 ENCOUNTER — Ambulatory Visit (HOSPITAL_COMMUNITY)
Admission: RE | Admit: 2022-01-04 | Discharge: 2022-01-04 | Disposition: A | Discharge status: Home / Self Care | Payer: 59 | Source: Ambulatory Visit | Attending: Internal Medicine | Admitting: Internal Medicine

## 2022-01-04 DIAGNOSIS — K754 Autoimmune hepatitis: Secondary | ICD-10-CM | POA: Insufficient documentation

## 2022-01-08 ENCOUNTER — Encounter: Payer: Self-pay | Admitting: Internal Medicine

## 2022-01-08 ENCOUNTER — Ambulatory Visit (INDEPENDENT_AMBULATORY_CARE_PROVIDER_SITE_OTHER): Payer: 59 | Admitting: Internal Medicine

## 2022-01-08 VITALS — HR 108 | Ht 66.0 in | Wt 121.2 lb

## 2022-01-08 DIAGNOSIS — Z1211 Encounter for screening for malignant neoplasm of colon: Secondary | ICD-10-CM | POA: Diagnosis not present

## 2022-01-08 DIAGNOSIS — K754 Autoimmune hepatitis: Secondary | ICD-10-CM

## 2022-01-08 NOTE — Progress Notes (Signed)
HISTORY OF PRESENT ILLNESS:  Rose Williams is a 60 y.o. female, [employer] and [employer] employee, who presents today for follow-up regarding management of autoimmune hepatitis diagnosed September 2015.  Last evaluated in this office September 07, 2020.  See that dictation.  She continues on azathioprine 100 mg daily.  She has been compliant with regular blood Checks.  These have been quite favorable over time.  Her CBC and liver tests in July 2022 and again September 2022 were entirely normal.  Her most recent abdominal ultrasound January 04, 2022 was normal.  Her GI review of systems is entirely negative.  She still contemplating colon cancer screening strategies.  She is still searching for PCP.  REVIEW OF SYSTEMS:  All non-GI ROS negative unless otherwise stated in the HPI except for food allergies  Past Medical History:  Diagnosis Date   Autoimmune hepatitis (HCC)    Cirrhosis (HCC)    Hydronephrosis    Jaundice    Kidney stones    Pyelonephritis     Past Surgical History:  Procedure Laterality Date   LITHOTRIPSY     2008    Social History [place] K Kalata  reports that she has never smoked. She has never used smokeless tobacco. She reports that she does not drink alcohol and does not use drugs.  family history includes Cancer in her mother; Cystic fibrosis in her child; Kidney Stones in an other family member.  Allergies  Allergen Reactions   Dairy Aid [Tilactase] Diarrhea and Nausea And Vomiting    Allergic to all dairy products   Gluten Meal Diarrhea and Nausea And Vomiting   Other Itching    Itching from melons, cantaloupe, eggplant   Pseudoephedrine Other (See Comments)    tachycardia   Whey Protein [Protein] Diarrhea and Nausea And Vomiting       PHYSICAL EXAMINATION: Vital signs: Pulse (!) 108    Ht 5\' 6"  (1.676 m)    Wt 121 lb 4 oz (55 kg)    SpO2 98%    BMI 19.57 kg/m   Constitutional: generally well-appearing, no acute distress Psychiatric: alert and oriented  x3, cooperative Eyes: extraocular movements intact, anicteric, conjunctiva pink Mouth: oral pharynx moist, no lesions Neck: supple no lymphadenopathy Cardiovascular: heart regular rate and rhythm, no murmur Lungs: clear to auscultation bilaterally Abdomen: soft, nontender, nondistended, no obvious ascites, no peritoneal signs, normal bowel sounds, no organomegaly Rectal: Omitted Extremities: no clubbing, cyanosis, or lower extremity edema bilaterally Skin: no lesions on visible extremities Neuro: No focal deficits.  No asterixis  ASSESSMENT:  1.  Autoimmune hepatitis with radiographic evidence of cirrhosis, previously.  Most recent ultrasound last week was unremarkable.  She remains asymptomatic on azathioprine 100 mg daily with normal liver tests and normal CBC. 2.  History of shingles 3.  Colon cancer screening   PLAN:  1.  Continue azathioprine 100 mg daily.  Prescription refilled.  Medication risks reviewed 2.  Continue blood work every 6 months unless otherwise directed 3.  Continue surveillance ultrasound of the liver annually unless otherwise directed 4.  Routine office follow-up 1 year.  Sooner if needed 5.  Obtain PCP.  Advised 6.  We discussed in some detail Cologuard testing and optical colonoscopy.  She tells me that she is interested in the latter.  She tells me that she needs to check her schedule and will contact our office to make a previsit appointment. A total time of 30 minutes was spent preparing to see the patient, reviewing blood work,  reviewing radiology studies, obtaining interval history, performing medically appropriate physical examination, counseling the patient regarding her above listed issues, ordering medications and reviewing the nature of colonoscopy, and documenting clinical information in the health record

## 2022-01-08 NOTE — Patient Instructions (Signed)
If you are age 60 or older, your body mass index should be between 23-30. Your Body mass index is 19.57 kg/m. If this is out of the aforementioned range listed, please consider follow up with your Primary Care Provider.  If you are age 14 or younger, your body mass index should be between 19-25. Your Body mass index is 19.57 kg/m. If this is out of the aformentioned range listed, please consider follow up with your Primary Care Provider.   ________________________________________________________  The [facility] providers would like to encourage you to use [facility] to communicate with providers for non-urgent requests or questions.  Due to long hold times on the telephone, sending your provider a message by [facility] may be a faster and more efficient way to get a response.  Please allow 48 business hours for a response.  Please remember that this is for non-urgent requests.  _______________________________________________________  Call us when you are ready to schedule a colonoscopy.  Please follow up in one year

## 2022-02-14 ENCOUNTER — Other Ambulatory Visit (HOSPITAL_COMMUNITY): Payer: Self-pay

## 2022-03-12 ENCOUNTER — Other Ambulatory Visit (HOSPITAL_COMMUNITY): Payer: Self-pay

## 2022-03-13 ENCOUNTER — Other Ambulatory Visit (HOSPITAL_COMMUNITY): Payer: Self-pay

## 2022-06-01 ENCOUNTER — Other Ambulatory Visit: Payer: Self-pay

## 2022-06-01 DIAGNOSIS — K754 Autoimmune hepatitis: Secondary | ICD-10-CM

## 2022-06-28 ENCOUNTER — Other Ambulatory Visit (INDEPENDENT_AMBULATORY_CARE_PROVIDER_SITE_OTHER): Payer: 59

## 2022-06-28 DIAGNOSIS — K754 Autoimmune hepatitis: Secondary | ICD-10-CM | POA: Diagnosis not present

## 2022-06-28 LAB — CBC WITH DIFFERENTIAL/PLATELET
Basophils Absolute: 0 10*3/uL (ref 0.0–0.1)
Basophils Relative: 0.3 % (ref 0.0–3.0)
Eosinophils Absolute: 0.4 10*3/uL (ref 0.0–0.7)
Eosinophils Relative: 8 % — ABNORMAL HIGH (ref 0.0–5.0)
HCT: 42.5 % (ref 36.0–46.0)
Hemoglobin: 14.1 g/dL (ref 12.0–15.0)
Lymphocytes Relative: 17.9 % (ref 12.0–46.0)
Lymphs Abs: 0.9 10*3/uL (ref 0.7–4.0)
MCHC: 33.2 g/dL (ref 30.0–36.0)
MCV: 99.7 fl (ref 78.0–100.0)
Monocytes Absolute: 0.4 10*3/uL (ref 0.1–1.0)
Monocytes Relative: 8.7 % (ref 3.0–12.0)
Neutro Abs: 3.1 10*3/uL (ref 1.4–7.7)
Neutrophils Relative %: 65.1 % (ref 43.0–77.0)
Platelets: 185 10*3/uL (ref 150.0–400.0)
RBC: 4.26 Mil/uL (ref 3.87–5.11)
RDW: 13.5 % (ref 11.5–15.5)
WBC: 4.8 10*3/uL (ref 4.0–10.5)

## 2022-06-28 LAB — HEPATIC FUNCTION PANEL
ALT: 13 U/L (ref 0–35)
AST: 23 U/L (ref 0–37)
Albumin: 4.6 g/dL (ref 3.5–5.2)
Alkaline Phosphatase: 70 U/L (ref 39–117)
Bilirubin, Direct: 0.2 mg/dL (ref 0.0–0.3)
Total Bilirubin: 0.7 mg/dL (ref 0.2–1.2)
Total Protein: 7.8 g/dL (ref 6.0–8.3)

## 2022-06-29 ENCOUNTER — Other Ambulatory Visit: Payer: Self-pay

## 2022-06-29 DIAGNOSIS — K754 Autoimmune hepatitis: Secondary | ICD-10-CM

## 2022-07-24 ENCOUNTER — Other Ambulatory Visit (HOSPITAL_COMMUNITY): Payer: Self-pay

## 2022-07-26 ENCOUNTER — Other Ambulatory Visit: Payer: Self-pay

## 2022-07-26 ENCOUNTER — Inpatient Hospital Stay: Admit: 2022-07-26 | Payer: 59 | Admitting: Urology

## 2022-07-26 ENCOUNTER — Inpatient Hospital Stay (HOSPITAL_BASED_OUTPATIENT_CLINIC_OR_DEPARTMENT_OTHER)
Admission: EM | Admit: 2022-07-26 | Discharge: 2022-07-28 | DRG: 661 | Disposition: A | Discharge status: Home / Self Care | Payer: 59 | Attending: Internal Medicine | Admitting: Internal Medicine

## 2022-07-26 ENCOUNTER — Emergency Department (HOSPITAL_BASED_OUTPATIENT_CLINIC_OR_DEPARTMENT_OTHER): Payer: 59

## 2022-07-26 ENCOUNTER — Encounter (HOSPITAL_COMMUNITY)
Admission: EM | Disposition: A | Discharge status: Home / Self Care | Payer: Self-pay | Source: Home / Self Care | Attending: Internal Medicine

## 2022-07-26 ENCOUNTER — Encounter (HOSPITAL_BASED_OUTPATIENT_CLINIC_OR_DEPARTMENT_OTHER): Payer: Self-pay | Admitting: Emergency Medicine

## 2022-07-26 DIAGNOSIS — Z79624 Long term (current) use of inhibitors of nucleotide synthesis: Secondary | ICD-10-CM

## 2022-07-26 DIAGNOSIS — K7460 Unspecified cirrhosis of liver: Secondary | ICD-10-CM | POA: Diagnosis not present

## 2022-07-26 DIAGNOSIS — N390 Urinary tract infection, site not specified: Secondary | ICD-10-CM | POA: Diagnosis not present

## 2022-07-26 DIAGNOSIS — Z87442 Personal history of urinary calculi: Secondary | ICD-10-CM | POA: Diagnosis not present

## 2022-07-26 DIAGNOSIS — N3289 Other specified disorders of bladder: Secondary | ICD-10-CM | POA: Diagnosis not present

## 2022-07-26 DIAGNOSIS — N136 Pyonephrosis: Principal | ICD-10-CM | POA: Diagnosis present

## 2022-07-26 DIAGNOSIS — N3000 Acute cystitis without hematuria: Secondary | ICD-10-CM | POA: Diagnosis not present

## 2022-07-26 DIAGNOSIS — K754 Autoimmune hepatitis: Secondary | ICD-10-CM | POA: Diagnosis present

## 2022-07-26 DIAGNOSIS — R109 Unspecified abdominal pain: Secondary | ICD-10-CM | POA: Diagnosis present

## 2022-07-26 DIAGNOSIS — Z91018 Allergy to other foods: Secondary | ICD-10-CM

## 2022-07-26 DIAGNOSIS — N201 Calculus of ureter: Secondary | ICD-10-CM | POA: Diagnosis not present

## 2022-07-26 DIAGNOSIS — N135 Crossing vessel and stricture of ureter without hydronephrosis: Secondary | ICD-10-CM | POA: Diagnosis not present

## 2022-07-26 DIAGNOSIS — N132 Hydronephrosis with renal and ureteral calculous obstruction: Principal | ICD-10-CM

## 2022-07-26 DIAGNOSIS — E876 Hypokalemia: Secondary | ICD-10-CM | POA: Diagnosis not present

## 2022-07-26 DIAGNOSIS — Z91011 Allergy to milk products: Secondary | ICD-10-CM

## 2022-07-26 DIAGNOSIS — Z888 Allergy status to other drugs, medicaments and biological substances status: Secondary | ICD-10-CM

## 2022-07-26 DIAGNOSIS — Z9102 Food additives allergy status: Secondary | ICD-10-CM

## 2022-07-26 DIAGNOSIS — B9620 Unspecified Escherichia coli [E. coli] as the cause of diseases classified elsewhere: Secondary | ICD-10-CM | POA: Diagnosis not present

## 2022-07-26 DIAGNOSIS — N3001 Acute cystitis with hematuria: Secondary | ICD-10-CM | POA: Diagnosis not present

## 2022-07-26 DIAGNOSIS — N1330 Unspecified hydronephrosis: Secondary | ICD-10-CM | POA: Diagnosis not present

## 2022-07-26 HISTORY — PX: CYSTOSCOPY W/ URETERAL STENT PLACEMENT: SHX1429

## 2022-07-26 LAB — CBC WITH DIFFERENTIAL/PLATELET
Abs Immature Granulocytes: 0.04 10*3/uL (ref 0.00–0.07)
Basophils Absolute: 0 10*3/uL (ref 0.0–0.1)
Basophils Relative: 0 %
Eosinophils Absolute: 0 10*3/uL (ref 0.0–0.5)
Eosinophils Relative: 0 %
HCT: 40.4 % (ref 36.0–46.0)
Hemoglobin: 13.9 g/dL (ref 12.0–15.0)
Immature Granulocytes: 0 %
Lymphocytes Relative: 5 %
Lymphs Abs: 0.5 10*3/uL — ABNORMAL LOW (ref 0.7–4.0)
MCH: 32.9 pg (ref 26.0–34.0)
MCHC: 34.4 g/dL (ref 30.0–36.0)
MCV: 95.5 fL (ref 80.0–100.0)
Monocytes Absolute: 0.7 10*3/uL (ref 0.1–1.0)
Monocytes Relative: 8 %
Neutro Abs: 8.5 10*3/uL — ABNORMAL HIGH (ref 1.7–7.7)
Neutrophils Relative %: 87 %
Platelets: 226 10*3/uL (ref 150–400)
RBC: 4.23 MIL/uL (ref 3.87–5.11)
RDW: 12.5 % (ref 11.5–15.5)
WBC: 9.8 10*3/uL (ref 4.0–10.5)
nRBC: 0 % (ref 0.0–0.2)

## 2022-07-26 LAB — COMPREHENSIVE METABOLIC PANEL
ALT: 6 U/L (ref 0–44)
AST: 12 U/L — ABNORMAL LOW (ref 15–41)
Albumin: 4.2 g/dL (ref 3.5–5.0)
Alkaline Phosphatase: 55 U/L (ref 38–126)
Anion gap: 13 (ref 5–15)
BUN: 10 mg/dL (ref 6–20)
CO2: 23 mmol/L (ref 22–32)
Calcium: 9.5 mg/dL (ref 8.9–10.3)
Chloride: 97 mmol/L — ABNORMAL LOW (ref 98–111)
Creatinine, Ser: 0.91 mg/dL (ref 0.44–1.00)
GFR, Estimated: >60 mL/min (ref >60)
Glucose, Bld: 138 mg/dL — ABNORMAL HIGH (ref 70–99)
Potassium: 3.2 mmol/L — ABNORMAL LOW (ref 3.5–5.1)
Sodium: 133 mmol/L — ABNORMAL LOW (ref 135–145)
Total Bilirubin: 0.8 mg/dL (ref 0.3–1.2)
Total Protein: 8.2 g/dL — ABNORMAL HIGH (ref 6.5–8.1)

## 2022-07-26 LAB — URINALYSIS, ROUTINE W REFLEX MICROSCOPIC
Bilirubin Urine: NEGATIVE
Glucose, UA: NEGATIVE mg/dL
Ketones, ur: NEGATIVE mg/dL
Nitrite: NEGATIVE
Protein, ur: 30 mg/dL — AB
Specific Gravity, Urine: <1.005 — ABNORMAL LOW (ref 1.005–1.030)
pH: 6.5 (ref 5.0–8.0)

## 2022-07-26 LAB — LIPASE, BLOOD: Lipase: 14 U/L (ref 11–51)

## 2022-07-26 SURGERY — CYSTOSCOPY, WITH RETROGRADE PYELOGRAM AND URETERAL STENT INSERTION
Anesthesia: General | Site: Ureter | Laterality: Right

## 2022-07-26 MED ORDER — FENTANYL CITRATE (PF) 100 MCG/2ML IJ SOLN
INTRAMUSCULAR | Status: AC
  Filled 2022-07-26: qty 2

## 2022-07-26 MED ORDER — LIDOCAINE HCL (PF) 2 % IJ SOLN
INTRAMUSCULAR | Status: AC
  Filled 2022-07-26: qty 5

## 2022-07-26 MED ORDER — PROPOFOL 10 MG/ML IV BOLUS
INTRAVENOUS | Status: AC
  Filled 2022-07-26: qty 20

## 2022-07-26 MED ORDER — SUCCINYLCHOLINE CHLORIDE 200 MG/10ML IV SOSY
PREFILLED_SYRINGE | INTRAVENOUS | Status: AC
  Filled 2022-07-26: qty 10

## 2022-07-26 MED ORDER — ONDANSETRON HCL 4 MG/2ML IJ SOLN
INTRAMUSCULAR | Status: AC
  Filled 2022-07-26: qty 2

## 2022-07-26 MED ORDER — DIPHENHYDRAMINE HCL 50 MG/ML IJ SOLN
INTRAMUSCULAR | Status: AC
  Filled 2022-07-26: qty 1

## 2022-07-26 MED ORDER — LACTATED RINGERS IV BOLUS
2000.0000 mL | Freq: Once | INTRAVENOUS | Status: AC
  Administered 2022-07-26: 2000 mL via INTRAVENOUS

## 2022-07-26 MED ORDER — SODIUM CHLORIDE 0.9 % IV SOLN
2.0000 g | Freq: Once | INTRAVENOUS | Status: AC
  Administered 2022-07-26: 2 g via INTRAVENOUS
  Filled 2022-07-26: qty 20

## 2022-07-26 SURGICAL SUPPLY — 22 items
BAG URO CATCHER STRL LF (MISCELLANEOUS) ×2 IMPLANT
CATH URETL OPEN 5X70 (CATHETERS) ×1 IMPLANT
CATH URETL OPEN END 6FR 70 (CATHETERS) ×1 IMPLANT
CLOTH BEACON ORANGE TIMEOUT ST (SAFETY) ×2 IMPLANT
GLOVE BIOGEL M 7.0 STRL (GLOVE) ×2 IMPLANT
GLOVE BIOGEL PI IND STRL 7.5 (GLOVE) IMPLANT
GLOVE BIOGEL PI IND STRL 8 (GLOVE) IMPLANT
GLOVE BIOGEL PI INDICATOR 7.5 (GLOVE) ×1
GLOVE BIOGEL PI INDICATOR 8 (GLOVE) ×1
GLOVE SURG ORTHO 8.0 STRL STRW (GLOVE) ×1 IMPLANT
GOWN STRL REUS W/ TWL XL LVL3 (GOWN DISPOSABLE) ×1 IMPLANT
GOWN STRL REUS W/TWL XL LVL3 (GOWN DISPOSABLE) ×6
GUIDEWIRE STR DUAL SENSOR (WIRE) ×3 IMPLANT
GUIDEWIRE ZIPWRE .038 STRAIGHT (WIRE) ×1 IMPLANT
MANIFOLD NEPTUNE II (INSTRUMENTS) ×2 IMPLANT
PACK CYSTO (CUSTOM PROCEDURE TRAY) ×2 IMPLANT
SET IRRIG Y TYPE TUR BLADDER L (SET/KITS/TRAYS/PACK) ×1 IMPLANT
STENT CONTOUR 6FRX26X.038 (STENTS) ×1 IMPLANT
SYR 10ML LL (SYRINGE) ×2 IMPLANT
TRAY FOLEY MTR SLVR 16FR STAT (SET/KITS/TRAYS/PACK) ×1 IMPLANT
TUBING CONNECTING 10 (TUBING) ×2 IMPLANT
TUBING UROLOGY SET (TUBING) IMPLANT

## 2022-07-26 NOTE — ED Provider Notes (Signed)
[facility] EMERGENCY DEPT Provider Note   CSN: [id number] Arrival date & time: 07/26/22  1730     History  Right flank pain CC  Rose Williams is a 61 y.o. female.  HPI Patient has a striae of autoimmune hepatitis, pyelonephritis, liver disease  Pt started last night with some nausea and malaise.  She hasnt measured a fever but she has felt sweaty at times.  She has had some diarrhea but no vomiting.  SHe is having some pain in the flank on the right side.  She has had kidney stones before.   Home Medications Prior to Admission medications   Medication Sig Start Date End Date Taking? Authorizing Provider  azaTHIOprine (IMURAN) 50 MG tablet Take 2 tablets (100 mg total) by mouth daily. 12/19/21   Hilarie Fredrickson, MD      Allergies    Dairy aid [tilactase], Gluten meal, Other, Pseudoephedrine, and Whey protein [protein]    Review of Systems   Review of Systems  Physical Exam Updated Vital Signs BP (!) 174/103   Pulse (!) 103   Resp 16   Ht 1.676 m (5\' 6" )   Wt 53.5 kg   SpO2 97%   BMI 19.05 kg/m  Physical Exam Vitals and nursing note reviewed.  Constitutional:      Appearance: She is well-developed.  HENT:     Head: Normocephalic and atraumatic.     Right Ear: External ear normal.     Left Ear: External ear normal.  Eyes:     General: No scleral icterus.       Right eye: No discharge.        Left eye: No discharge.     Conjunctiva/sclera: Conjunctivae normal.  Neck:     Trachea: No tracheal deviation.  Cardiovascular:     Rate and Rhythm: Regular rhythm. Tachycardia present.  Pulmonary:     Effort: Pulmonary effort is normal. No respiratory distress.     Breath sounds: Normal breath sounds. No stridor. No wheezing or rales.  Abdominal:     General: Bowel sounds are normal. There is no distension.     Palpations: Abdomen is soft.     Tenderness: There is no abdominal tenderness. There is right CVA tenderness. There is no guarding or  rebound.  Musculoskeletal:        General: No tenderness or deformity.     Cervical back: Neck supple.  Skin:    General: Skin is warm and dry.     Findings: No rash.  Neurological:     General: No focal deficit present.     Mental Status: She is alert.     Cranial Nerves: No cranial nerve deficit (no facial droop, extraocular movements intact, no slurred speech).     Sensory: No sensory deficit.     Motor: No abnormal muscle tone or seizure activity.     Coordination: Coordination normal.  Psychiatric:        Mood and Affect: Mood normal.     ED Results / Procedures / Treatments   Labs (all labs ordered are listed, but only abnormal results are displayed) Labs Reviewed  COMPREHENSIVE METABOLIC PANEL - Abnormal; Notable for the following components:      Result Value   Sodium 133 (*)    Potassium 3.2 (*)    Chloride 97 (*)    Glucose, Bld 138 (*)    Total Protein 8.2 (*)    AST 12 (*)    All other components  within normal limits  CBC WITH DIFFERENTIAL/PLATELET - Abnormal; Notable for the following components:   Neutro Abs 8.5 (*)    Lymphs Abs 0.5 (*)    All other components within normal limits  URINALYSIS, ROUTINE W REFLEX MICROSCOPIC - Abnormal; Notable for the following components:   APPearance HAZY (*)    Specific Gravity, Urine <1.005 (*)    Hgb urine dipstick SMALL (*)    Protein, ur 30 (*)    Leukocytes,Ua LARGE (*)    Bacteria, UA MANY (*)    All other components within normal limits  URINE CULTURE  LIPASE, BLOOD    EKG None  Radiology CT Renal Stone Study  Result Date: 07/26/2022 CLINICAL DATA:  Right flank pain, nausea/vomiting/diarrhea EXAM: CT ABDOMEN AND PELVIS WITHOUT CONTRAST TECHNIQUE: Multidetector CT imaging of the abdomen and pelvis was performed following the standard protocol without IV contrast. RADIATION DOSE REDUCTION: This exam was performed according to the departmental dose-optimization program which includes automated exposure  control, adjustment of the mA and/or kV according to patient size and/or use of iterative reconstruction technique. COMPARISON:  Right upper quadrant ultrasound dated 01/04/2022. CT abdomen/pelvis dated 06/06/2017. FINDINGS: Lower chest: Lung bases are clear, noting mild linear scarring/atelectasis at the left lung base. Hepatobiliary: Unenhanced liver is unremarkable. Gallbladder is unremarkable. No intrahepatic or extrahepatic ductal dilatation. Pancreas: Within normal limits. Spleen: Within normal limits been Adrenals/Urinary Tract: Adrenal glands are within normal limits. Left renal cortical scarring/atrophy with mild fullness of the left renal collecting system and multiple nonobstructing left lower pole renal calculi measuring up to 5 mm (series 3/image 32). Right kidney is notable for perirenal edema, multiple nonobstructing calculi measuring up to 5 mm, and severe hydroureteronephrosis with two adjacent proximal ureteral calculi measuring up to 6 mm at the L4 level (coronal image 55). Bladder is within normal limits. Stomach/Bowel: Stomach is within normal limits. No evidence of bowel obstruction. Normal appendix (series 3/image 61). No colonic wall thickening or inflammatory changes. Vascular/Lymphatic: No evidence of abdominal aortic aneurysm. No suspicious abdominopelvic lymphadenopathy. Reproductive: Uterus is within normal limits. Bilateral ovaries are within normal limits. Other: No abdominopelvic ascites. Musculoskeletal: Mild degenerative changes at L3-4. IMPRESSION: Two adjacent proximal right ureteral calculi measuring up to 6 mm at the L4 level. Associated severe right hydroureteronephrosis. Additional bilateral nonobstructing renal calculi measuring up to 5 mm. Left renal cortical scarring/atrophy. Electronically Signed   By: Charline Bills M.D.   On: 07/26/2022 20:39    Procedures Procedures    Medications Ordered in ED Medications  lactated ringers bolus 2,000 mL (0 mLs Intravenous  Stopped 07/26/22 2201)  cefTRIAXone (ROCEPHIN) 2 g in sodium chloride 0.9 % 100 mL IVPB (0 g Intravenous Stopped 07/26/22 2059)    ED Course/ Medical Decision Making/ A&P Clinical Course as of 07/26/22 2226  Thu Jul 26, 2022  2101 CT Renal Stone Study CT scan shows ureteral stones with severe hydronephrosis [JK]  2101 CBC with Diff(!) Normal [JK]  2101 Urinalysis, Routine w reflex microscopic Urine, Clean Catch(!) Consistent with urinary tract infection [JK]  2102 Comprehensive metabolic panel(!) Mild hypokalemia and hyponatremia noted [JK]  2128 Hypertension noted.  Patient states she has whitecoat hypertension and normally does not take blood pressure medications [JK]  2145 Case discussed with Dr Gillian Scarce.  Plan is urologic intervention, probable stenting tonight.  We will transfer ed to ed [JK]  2209 Case discussed with Dr Charm Barges at [facility] ED [JK]  2218 Discussed with Dr Antionette Char regarding admission [JK]  Clinical Course User Index [JK] Linwood Dibbles, MD                           Medical Decision Making Problems Addressed: Acute cystitis without hematuria: acute illness or injury that poses a threat to life or bodily functions Ureteral stone with hydronephrosis: acute illness or injury that poses a threat to life or bodily functions  Amount and/or Complexity of Data Reviewed Labs: ordered. Decision-making details documented in ED Course. Radiology: ordered. Decision-making details documented in ED Course.  Risk Decision regarding hospitalization.   Patient presented to the ED with flank pain malaise.  Patient with history of prior ureteral stones.  Patient also on immunosuppressive agents. Patient without fever or leukocytosis but she has had systemic symptoms and has evidence of urinary tract infection.  CT scan shows evidence of obstructed ureteral stones.  I am concerned about worsening infection and developing pyelonephritis.  Patient has been started on IV Rocephin.  Discussed case  with urology and we will plan on transfer to [facility] long ED for a emergent urological intervention.  I have consulted with Dr. Antionette Char and the patient will be admitted to the hospital for further treatment.  Patient is here with her daughter.  She would prefer to transfer by POV.  Patient does have an IV in place.  She is not on any IV drips at this time.  I do think this is reasonable.       Final Clinical Impression(s) / ED Diagnoses Final diagnoses:  Ureteral stone with hydronephrosis  Acute cystitis without hematuria    Rx / DC Orders ED Discharge Orders     None         Linwood Dibbles, MD 07/26/22 2229

## 2022-07-26 NOTE — ED Notes (Signed)
Pt going to POV to [facility] ED. Pt instructed to go straight to the ED, to not make any stops, and to not eat or drink. Pt's IV flushed and securely wrapped with coban.

## 2022-07-26 NOTE — Anesthesia Preprocedure Evaluation (Addendum)
Anesthesia Evaluation  Patient identified by MRN, date of birth, ID band Patient awake    Reviewed: Allergy & Precautions, H&P , NPO status , Patient's Chart, lab work & pertinent test results, reviewed documented beta blocker date and time   Airway Mallampati: I  TM Distance: >3 FB Neck ROM: full    Dental no notable dental hx. (+) Teeth Intact, Dental Advisory Given   Pulmonary neg pulmonary ROS,    Pulmonary exam normal breath sounds clear to auscultation       Cardiovascular Exercise Tolerance: Good hypertension,  Rhythm:regular Rate:Normal     Neuro/Psych negative neurological ROS  negative psych ROS   GI/Hepatic negative GI ROS, (+) Hepatitis -, Autoimmune  Endo/Other  negative endocrine ROS  Renal/GU Renal disease  negative genitourinary   Musculoskeletal   Abdominal   Peds  Hematology negative hematology ROS (+)   Anesthesia Other Findings   Reproductive/Obstetrics negative OB ROS                            Anesthesia Physical Anesthesia Plan  ASA: 2 and emergent  Anesthesia Plan: General   Post-op Pain Management:    Induction: Intravenous and Cricoid pressure planned  PONV Risk Score and Plan: 3 and Ondansetron and Dexamethasone  Airway Management Planned: Oral ETT  Additional Equipment: None  Intra-op Plan:   Post-operative Plan: Extubation in OR  Informed Consent: I have reviewed the patients History and Physical, chart, labs and discussed the procedure including the risks, benefits and alternatives for the proposed anesthesia with the patient or authorized representative who has indicated his/her understanding and acceptance.     Dental Advisory Given  Plan Discussed with: CRNA and Anesthesiologist  Anesthesia Plan Comments: (  )       Anesthesia Quick Evaluation

## 2022-07-26 NOTE — ED Triage Notes (Signed)
Pt  here from home with c/o right side flank / side pain , had some n/v/d

## 2022-07-27 ENCOUNTER — Inpatient Hospital Stay (HOSPITAL_COMMUNITY): Payer: 59 | Admitting: Anesthesiology

## 2022-07-27 ENCOUNTER — Inpatient Hospital Stay (HOSPITAL_COMMUNITY): Payer: 59

## 2022-07-27 ENCOUNTER — Encounter (HOSPITAL_COMMUNITY): Payer: Self-pay | Admitting: Urology

## 2022-07-27 DIAGNOSIS — N3001 Acute cystitis with hematuria: Secondary | ICD-10-CM

## 2022-07-27 DIAGNOSIS — N390 Urinary tract infection, site not specified: Secondary | ICD-10-CM

## 2022-07-27 DIAGNOSIS — E876 Hypokalemia: Secondary | ICD-10-CM

## 2022-07-27 DIAGNOSIS — K754 Autoimmune hepatitis: Secondary | ICD-10-CM | POA: Diagnosis not present

## 2022-07-27 DIAGNOSIS — N135 Crossing vessel and stricture of ureter without hydronephrosis: Secondary | ICD-10-CM | POA: Diagnosis not present

## 2022-07-27 DIAGNOSIS — N201 Calculus of ureter: Secondary | ICD-10-CM

## 2022-07-27 LAB — COMPREHENSIVE METABOLIC PANEL
ALT: 8 U/L (ref 0–44)
AST: 13 U/L — ABNORMAL LOW (ref 15–41)
Albumin: 3.1 g/dL — ABNORMAL LOW (ref 3.5–5.0)
Alkaline Phosphatase: 52 U/L (ref 38–126)
Anion gap: 10 (ref 5–15)
BUN: 9 mg/dL (ref 6–20)
CO2: 23 mmol/L (ref 22–32)
Calcium: 8.8 mg/dL — ABNORMAL LOW (ref 8.9–10.3)
Chloride: 111 mmol/L (ref 98–111)
Creatinine, Ser: 0.78 mg/dL (ref 0.44–1.00)
GFR, Estimated: >60 mL/min (ref >60)
Glucose, Bld: 179 mg/dL — ABNORMAL HIGH (ref 70–99)
Potassium: 5.1 mmol/L (ref 3.5–5.1)
Sodium: 144 mmol/L (ref 135–145)
Total Bilirubin: 0.5 mg/dL (ref 0.3–1.2)
Total Protein: 7.1 g/dL (ref 6.5–8.1)

## 2022-07-27 LAB — CBC
HCT: 36.6 % (ref 36.0–46.0)
Hemoglobin: 12.6 g/dL (ref 12.0–15.0)
MCH: 33.1 pg (ref 26.0–34.0)
MCHC: 34.4 g/dL (ref 30.0–36.0)
MCV: 96.1 fL (ref 80.0–100.0)
Platelets: 190 10*3/uL (ref 150–400)
RBC: 3.81 MIL/uL — ABNORMAL LOW (ref 3.87–5.11)
RDW: 12.6 % (ref 11.5–15.5)
WBC: 10.2 10*3/uL (ref 4.0–10.5)
nRBC: 0 % (ref 0.0–0.2)

## 2022-07-27 LAB — MRSA NEXT GEN BY PCR, NASAL: MRSA by PCR Next Gen: NOT DETECTED

## 2022-07-27 LAB — MAGNESIUM: Magnesium: 2 mg/dL (ref 1.7–2.4)

## 2022-07-27 LAB — HIV ANTIBODY (ROUTINE TESTING W REFLEX): HIV Screen 4th Generation wRfx: NONREACTIVE

## 2022-07-27 MED ORDER — SODIUM CHLORIDE 0.9 % IV SOLN
2.0000 g | INTRAVENOUS | Status: DC
  Administered 2022-07-27: 2 g via INTRAVENOUS
  Filled 2022-07-27 (×2): qty 20

## 2022-07-27 MED ORDER — 0.9 % SODIUM CHLORIDE (POUR BTL) OPTIME
TOPICAL | Status: DC | PRN
  Administered 2022-07-27: 1000 mL

## 2022-07-27 MED ORDER — PROPOFOL 10 MG/ML IV BOLUS
INTRAVENOUS | Status: DC | PRN
  Administered 2022-07-27: 160 mg via INTRAVENOUS

## 2022-07-27 MED ORDER — ACETAMINOPHEN 325 MG PO TABS: 325.0000 mg | ORAL_TABLET | ORAL | Status: DC | PRN

## 2022-07-27 MED ORDER — ONDANSETRON HCL 4 MG/2ML IJ SOLN
INTRAMUSCULAR | Status: DC | PRN
  Administered 2022-07-27: 4 mg via INTRAVENOUS

## 2022-07-27 MED ORDER — OXYCODONE HCL 5 MG PO TABS: 5.0000 mg | ORAL_TABLET | Freq: Once | ORAL | Status: DC | PRN

## 2022-07-27 MED ORDER — MEPERIDINE HCL 50 MG/ML IJ SOLN: 6.2500 mg | INTRAMUSCULAR | Status: DC | PRN

## 2022-07-27 MED ORDER — POTASSIUM CHLORIDE 20 MEQ PO PACK
40.0000 meq | PACK | Freq: Once | ORAL | Status: AC
  Administered 2022-07-27: 40 meq via ORAL
  Filled 2022-07-27 (×2): qty 2

## 2022-07-27 MED ORDER — LACTATED RINGERS IV SOLN: INTRAVENOUS | Status: DC | PRN

## 2022-07-27 MED ORDER — ONDANSETRON HCL 4 MG/2ML IJ SOLN: 4.0000 mg | Freq: Once | INTRAMUSCULAR | Status: DC | PRN

## 2022-07-27 MED ORDER — POLYETHYLENE GLYCOL 3350 17 G PO PACK: 17.0000 g | PACK | Freq: Every day | ORAL | Status: DC | PRN

## 2022-07-27 MED ORDER — LACTATED RINGERS IV SOLN: INTRAVENOUS | Status: DC

## 2022-07-27 MED ORDER — ACETAMINOPHEN 325 MG PO TABS
650.0000 mg | ORAL_TABLET | Freq: Four times a day (QID) | ORAL | Status: DC | PRN
  Administered 2022-07-27: 650 mg via ORAL
  Filled 2022-07-27 (×2): qty 2

## 2022-07-27 MED ORDER — DIPHENHYDRAMINE HCL 50 MG/ML IJ SOLN
INTRAMUSCULAR | Status: DC | PRN
  Administered 2022-07-27: 12.5 mg via INTRAVENOUS

## 2022-07-27 MED ORDER — DEXAMETHASONE SODIUM PHOSPHATE 10 MG/ML IJ SOLN
INTRAMUSCULAR | Status: DC | PRN
  Administered 2022-07-27: 4 mg via INTRAVENOUS

## 2022-07-27 MED ORDER — IOHEXOL 300 MG/ML  SOLN
INTRAMUSCULAR | Status: DC | PRN
  Administered 2022-07-27: 12 mL via URETHRAL

## 2022-07-27 MED ORDER — SODIUM CHLORIDE 0.9 % IV SOLN
2.0000 g | INTRAVENOUS | Status: DC
Start: 2022-07-28 — End: 2022-07-27

## 2022-07-27 MED ORDER — SODIUM CHLORIDE 0.9 % IV SOLN: INTRAVENOUS | Status: AC

## 2022-07-27 MED ORDER — SODIUM CHLORIDE 0.9% FLUSH
3.0000 mL | Freq: Two times a day (BID) | INTRAVENOUS | Status: DC
Start: 2022-07-27 — End: 2022-07-28
  Administered 2022-07-27 (×2): 3 mL via INTRAVENOUS

## 2022-07-27 MED ORDER — ACETAMINOPHEN 10 MG/ML IV SOLN
INTRAVENOUS | Status: AC
  Filled 2022-07-27: qty 100

## 2022-07-27 MED ORDER — ACETAMINOPHEN 160 MG/5ML PO SOLN: 325.0000 mg | ORAL | Status: DC | PRN

## 2022-07-27 MED ORDER — OXYCODONE HCL 5 MG/5ML PO SOLN: 5.0000 mg | Freq: Once | ORAL | Status: DC | PRN

## 2022-07-27 MED ORDER — AZATHIOPRINE 50 MG PO TABS
100.0000 mg | ORAL_TABLET | Freq: Every day | ORAL | Status: DC
  Administered 2022-07-27: 100 mg via ORAL
  Filled 2022-07-27 (×2): qty 2

## 2022-07-27 MED ORDER — SUCCINYLCHOLINE CHLORIDE 200 MG/10ML IV SOSY
PREFILLED_SYRINGE | INTRAVENOUS | Status: DC | PRN
  Administered 2022-07-27: 140 mg via INTRAVENOUS

## 2022-07-27 MED ORDER — ACETAMINOPHEN 10 MG/ML IV SOLN
INTRAVENOUS | Status: DC | PRN
  Administered 2022-07-27: 1000 mg via INTRAVENOUS

## 2022-07-27 MED ORDER — LIDOCAINE HCL (CARDIAC) PF 100 MG/5ML IV SOSY
PREFILLED_SYRINGE | INTRAVENOUS | Status: DC | PRN
  Administered 2022-07-27: 100 mg via INTRAVENOUS

## 2022-07-27 MED ORDER — FENTANYL CITRATE (PF) 100 MCG/2ML IJ SOLN
INTRAMUSCULAR | Status: DC | PRN
  Administered 2022-07-27 (×2): 50 ug via INTRAVENOUS

## 2022-07-27 MED ORDER — SODIUM CHLORIDE 0.9 % IR SOLN
Status: DC | PRN
  Administered 2022-07-27: 4000 mL

## 2022-07-27 MED ORDER — ACETAMINOPHEN 650 MG RE SUPP: 650.0000 mg | Freq: Four times a day (QID) | RECTAL | Status: DC | PRN

## 2022-07-27 MED ORDER — FENTANYL CITRATE PF 50 MCG/ML IJ SOSY: 25.0000 ug | PREFILLED_SYRINGE | INTRAMUSCULAR | Status: DC | PRN

## 2022-07-27 MED ORDER — KETOROLAC TROMETHAMINE 15 MG/ML IJ SOLN
15.0000 mg | Freq: Once | INTRAMUSCULAR | Status: AC
Start: 2022-07-27 — End: 2022-07-27
  Administered 2022-07-27: 15 mg via INTRAVENOUS
  Filled 2022-07-27: qty 1

## 2022-07-27 NOTE — Progress Notes (Signed)
1 Day Post-Op Subjective: Denies abdominal pain or flank pain. Tolerating stent and foley. Having some bladder spasms. Temp curve downtrending.  Objective: Vital signs in last 24 hours: Temp:  [98.2 F (36.8 C)-101.7 F (38.7 C)] 98.6 F (37 C) (07/28 0333) Pulse Rate:  [85-124] 91 (07/28 0333) Resp:  [12-28] 14 (07/28 0333) BP: (137-187)/(89-119) 137/89 (07/28 0333) SpO2:  [93 %-99 %] 98 % (07/28 0333) Weight:  [53.5 kg] 53.5 kg (07/27 1818)  Intake/Output from previous day: 07/27 0701 - 07/28 0700 In: 1464.1 [P.O.:100; I.V.:1166.4; IV Piggyback:197.7] Out: 1300 [Urine:1300] Intake/Output this shift: No intake/output data recorded. UOP: 1.3L clear yellow in foley  Physical Exam:  General: Alert and oriented CV: RRR Lungs: Clear Abdomen: Soft, ND, NT Ext: NT, No erythema  Lab Results: Recent Labs    07/26/22 1821 07/27/22 0334  HGB 13.9 12.6  HCT 40.4 36.6   BMET Recent Labs    07/26/22 1821 07/27/22 0334  NA 133* 144  K 3.2* 5.1  CL 97* 111  CO2 23 23  GLUCOSE 138* 179*  BUN 10 9  CREATININE 0.91 0.78  CALCIUM 9.5 8.8*     Studies/Results: DG C-Arm 1-60 Min-No Report  Result Date: 07/27/2022 Fluoroscopy was utilized by the requesting physician.  No radiographic interpretation.   CT Renal Stone Study  Result Date: 07/26/2022 CLINICAL DATA:  Right flank pain, nausea/vomiting/diarrhea EXAM: CT ABDOMEN AND PELVIS WITHOUT CONTRAST TECHNIQUE: Multidetector CT imaging of the abdomen and pelvis was performed following the standard protocol without IV contrast. RADIATION DOSE REDUCTION: This exam was performed according to the departmental dose-optimization program which includes automated exposure control, adjustment of the mA and/or kV according to patient size and/or use of iterative reconstruction technique. COMPARISON:  Right upper quadrant ultrasound dated 01/04/2022. CT abdomen/pelvis dated 06/06/2017. FINDINGS: Lower chest: Lung bases are clear, noting  mild linear scarring/atelectasis at the left lung base. Hepatobiliary: Unenhanced liver is unremarkable. Gallbladder is unremarkable. No intrahepatic or extrahepatic ductal dilatation. Pancreas: Within normal limits. Spleen: Within normal limits been Adrenals/Urinary Tract: Adrenal glands are within normal limits. Left renal cortical scarring/atrophy with mild fullness of the left renal collecting system and multiple nonobstructing left lower pole renal calculi measuring up to 5 mm (series 3/image 32). Right kidney is notable for perirenal edema, multiple nonobstructing calculi measuring up to 5 mm, and severe hydroureteronephrosis with two adjacent proximal ureteral calculi measuring up to 6 mm at the L4 level (coronal image 55). Bladder is within normal limits. Stomach/Bowel: Stomach is within normal limits. No evidence of bowel obstruction. Normal appendix (series 3/image 61). No colonic wall thickening or inflammatory changes. Vascular/Lymphatic: No evidence of abdominal aortic aneurysm. No suspicious abdominopelvic lymphadenopathy. Reproductive: Uterus is within normal limits. Bilateral ovaries are within normal limits. Other: No abdominopelvic ascites. Musculoskeletal: Mild degenerative changes at L3-4. IMPRESSION: Two adjacent proximal right ureteral calculi measuring up to 6 mm at the L4 level. Associated severe right hydroureteronephrosis. Additional bilateral nonobstructing renal calculi measuring up to 5 mm. Left renal cortical scarring/atrophy. Electronically Signed   By: Charline Bills M.D.   On: 07/26/2022 20:39    Assessment/Plan: Obstructing right ureteral stones with pyelonephritis s/p right ureteral stent 07/27/2022 Bilateral non-obstructing renal stones  -Pain control prn -Diet as tolerated -Void trial tomorrow AM -Followup urine culture. Discharge home with 10-14 days of culture specific antibiotics -Will arrange f/u for definitive treatment of stones -Following peripherally. Please  call with questions.   LOS: 1 day   Matt R. Chelby Salata MD 07/27/2022, 7:08  AM [facility]  Pager: [phone]

## 2022-07-27 NOTE — Anesthesia Procedure Notes (Signed)
Procedure Name: Intubation Date/Time: 07/27/2022 12:14 AM  Performed by: Raenette Rover, CRNAPre-anesthesia Checklist: Patient identified, Emergency Drugs available, Suction available and Patient being monitored Patient Re-evaluated:Patient Re-evaluated prior to induction Oxygen Delivery Method: Circle system utilized Preoxygenation: Pre-oxygenation with 100% oxygen Induction Type: IV induction, Rapid sequence and Cricoid Pressure applied Laryngoscope Size: Mac and 3 Grade View: Grade I Tube type: Oral Tube size: 7.0 mm Number of attempts: 1 Airway Equipment and Method: Stylet Placement Confirmation: ETT inserted through vocal cords under direct vision, positive ETCO2 and breath sounds checked- equal and bilateral Secured at: 20 cm Tube secured with: Tape Dental Injury: Teeth and Oropharynx as per pre-operative assessment

## 2022-07-27 NOTE — Op Note (Signed)
Operative Note  Preoperative diagnosis:  1.  Right obstructing ureteral stone 2. Urinary tract infection  Postoperative diagnosis: 1.  Right obstructing ureteral stone 2. Urinary tract infection  Procedure(s): 1.  Cystoscopy 2. Right retrograde pyelogram with interpretation 3. Diagnostic right ureteroscopy 4. Right ureteral stent placement 5. Fluoroscopy <1 hour with intraoperative interpretation  Surgeon: Jettie Pagan, MD  Assistants:  None  Anesthesia:  General  Complications:  None  EBL:  Minimal  Specimens: 1. Right renal pelvis urine culture  Drains/Catheters: 1.  Right 6Fr x 26cm ureteral stent  Intraoperative findings:   Cystoscopy demonstrated no suspicious lesions, masses, stones or other pathology. Right retrograde pyelogram demonstrated severe right hydronephrosis. Initially, the wire was passed submucosally adjacent to the stone and retrograde pyelogram demonstrated medial extravasation of contrast medial to the UPJ. Right ureteroscopy confirmed an impacted right ureteral stone. I was able to navigate a wire in the true lumen proximal to the stone and in the right renal pelvis. Right retrograde pyelogram confirmed severe right hydronephrosis Successful right ureteral stent placement with curl in the renal pelvis and bladder respectively.  Indication:  Rose Williams is a 61 y.o. female with an obstructing right ureteral stone with sign of infection. After reviewing the management options for treatment, she elected to proceed with the above surgical procedure(s). We have discussed the potential benefits and risks of the procedure, side effects of the proposed treatment, the likelihood of the patient achieving the goals of the procedure, and any potential problems that might occur during the procedure or recuperation. Informed consent has been obtained.  Description of procedure: The patient was taken to the operating room and general anesthesia was induced.  The  patient was placed in the dorsal lithotomy position, prepped and draped in the usual sterile fashion, and preoperative antibiotics were administered. A preoperative time-out was performed.   Scout film demonstrated 38mm proximal right ureteral stone. Cystourethroscopy was performed.  The patient's urethra was examined and was normal. The bladder was then systematically examined in its entirety. There was no evidence for any bladder tumors, stones, or other mucosal pathology.    Attention then turned to the right ureteral orifice. A 0.038 zip wire was passed through the right orifice and over the wire a 5 Fr open ended catheter was inserted and passed up to the level of the stone. The wire did not advance 2cm beyond the proximal stone and instead curled. A retrograde pyelogram demonstrated contrast that did not enter the renal pelvis. We left this wire in place and placed a semirigid ureteroscope. We encountered an impacted proximal right ureteral stone. I was able to navigate a glidewire into the true lumen and into the renal pelvis. There was a hydronephrotic drip. This was sent as right renal pelvis urine culture. This was slightly cloudy. We performed a repeat retrograde pyelogram with contrast filling the upper pole calyces and renal pelvis. Wire was replaced.   A 6Fr x 26cm ureteral stent was advance over the wire. The stent was positioned appropriately under fluoroscopic and cystoscopic guidance. There was a curl in the renal pelvis and bladder. There was urine emanating from around the stent and through the holes of the stent following placement.  There was a small amount of persistent extravasation of contrast medial to the stone extending towards the right renal pelvis from previous passage of submucosal wire.  The bladder was then emptied, foley catheter was placed and the procedure ended.  The patient appeared to tolerate the procedure well.  The patient was able to be awakened and transferred to  the recovery unit in satisfactory condition.   Plan:  Admit to medicine. F/u urine culture. Continue broad spectrum antibiotics.  Rose R. Lilyanna Lunt MD [facility]  Pager: [phone]

## 2022-07-27 NOTE — Plan of Care (Signed)
  Problem: Education: Goal: Knowledge of General Education information will improve Description: Including pain rating scale, medication(s)/side effects and non-pharmacologic comfort measures Outcome: Progressing   Problem: Health Behavior/Discharge Planning: Goal: Ability to manage health-related needs will improve Outcome: Progressing   Problem: Clinical Measurements: Goal: Ability to maintain clinical measurements within normal limits will improve Outcome: Progressing Goal: Diagnostic test results will improve Outcome: Progressing Goal: Cardiovascular complication will be avoided Outcome: Progressing   Problem: Activity: Goal: Risk for activity intolerance will decrease Outcome: Progressing   Problem: Nutrition: Goal: Adequate nutrition will be maintained Outcome: Progressing   Problem: Coping: Goal: Level of anxiety will decrease Outcome: Progressing   Problem: Elimination: Goal: Will not experience complications related to urinary retention Outcome: Progressing   Problem: Pain Managment: Goal: General experience of comfort will improve Outcome: Progressing   Problem: Safety: Goal: Ability to remain free from injury will improve Outcome: Progressing   Problem: Skin Integrity: Goal: Risk for impaired skin integrity will decrease Outcome: Progressing

## 2022-07-27 NOTE — Transfer of Care (Signed)
Immediate Anesthesia Transfer of Care Note  Patient: Rose Williams  Procedure(s) Performed: CYSTOSCOPY WITH RETROGRADE PYELOGRAM/URETERAL STENT PLACEMENT, AND URETEROSCOPY (Right: Ureter)  Patient Location: PACU  Anesthesia Type:General  Level of Consciousness: drowsy  Airway & Oxygen Therapy: Patient Spontanous Breathing  Post-op Assessment: Report given to RN and Post -op Vital signs reviewed and stable  Post vital signs: Reviewed and stable  Last Vitals:  Vitals Value Taken Time  BP 144/95 07/27/22 0101  Temp    Pulse 103 07/27/22 0103  Resp 18 07/27/22 0103  SpO2 93 % 07/27/22 0103  Vitals shown include unvalidated device data.  Last Pain:  Vitals:   07/26/22 2318  TempSrc: Oral  PainSc:          Complications: No notable events documented.

## 2022-07-27 NOTE — Progress Notes (Signed)
Chaplain engaged in a conversation with Rose Williams and provided prayer, at [employer] request.  Rose Williams shared about the meaningful work she gets to do at [employer] and some about her family as well.  Chaplain provided social as well as spiritual support.   [address], [place] Pager, [phone]

## 2022-07-27 NOTE — TOC Initial Note (Signed)
Transition of Care [facility]) - Initial/Assessment Note    Patient Details  Name: Rose Williams MRN: [id number] Date of Birth: 12-31-1961  Transition of Care [facility]) CM/SW Contact:    Golda Acre, RN Phone Number: 07/27/2022, 8:50 AM  Clinical Narrative:                  Transition of Care [facility]) Screening Note   Patient Details  Name: Rose Williams Date of Birth: 07-Sep-1961   Transition of Care [facility]) CM/SW Contact:    Golda Acre, RN Phone Number: 07/27/2022, 8:50 AM    Transition of Care Department [facility]) has reviewed patient and no TOC needs have been identified at this time. We will continue to monitor patient advancement through interdisciplinary progression rounds. If new patient transition needs arise, please place a TOC consult.    Expected Discharge Plan: Home/Self Care Barriers to Discharge: Continued Medical Work up   Patient Goals and CMS Choice Patient states their goals for this hospitalization and ongoing recovery are:: to go home CMS Medicare.gov Compare Post Acute Care list provided to:: Patient    Expected Discharge Plan and Services Expected Discharge Plan: Home/Self Care   Discharge Planning Services: CM Consult   Living arrangements for the past 2 months: Single Family Home                                      Prior Living Arrangements/Services Living arrangements for the past 2 months: Single Family Home Lives with:: Spouse Patient language and need for interpreter reviewed:: Yes Do you feel safe going back to the place where you live?: Yes            Criminal Activity/Legal Involvement Pertinent to Current Situation/Hospitalization: No - Comment as needed  Activities of Daily Living Home Assistive Devices/Equipment: None ADL Screening (condition at time of admission) Patient's cognitive ability adequate to safely complete daily activities?: Yes Is the patient deaf or have difficulty hearing?: No Does the  patient have difficulty seeing, even when wearing glasses/contacts?: No Does the patient have difficulty concentrating, remembering, or making decisions?: No Patient able to express need for assistance with ADLs?: Yes Does the patient have difficulty dressing or bathing?: No Independently performs ADLs?: Yes (appropriate for developmental age) Does the patient have difficulty walking or climbing stairs?: No Weakness of Legs: None Weakness of Arms/Hands: None  Permission Sought/Granted                  Emotional Assessment Appearance:: Appears stated age Attitude/Demeanor/Rapport: Engaged Affect (typically observed): Calm Orientation: : Oriented to Self, Oriented to Place, Oriented to  Time, Oriented to Situation Alcohol / Substance Use: Not Applicable Psych Involvement: No (comment)  Admission diagnosis:  Acute cystitis without hematuria [N30.00] Ureteral stone with hydronephrosis [N13.2] Obstruction of right ureter [N13.5] Patient Active Problem List   Diagnosis Date Noted   Autoimmune hepatitis (HCC) 07/27/2022   UTI (urinary tract infection) 07/27/2022   Hypokalemia 07/27/2022   Obstruction of right ureter 07/26/2022   Jaundice 09/06/2014   Pyelonephritis 09/06/2014   Hydronephrosis 09/06/2014   EUSTACHIAN TUBE DYSFUNCTION, LEFT 01/27/2008   Calculus of kidney 01/27/2008   ELEVATED BLOOD PRESSURE WITHOUT DIAGNOSIS OF HYPERTENSION 01/27/2008   PCP:  Patient, No Pcp Per Pharmacy:   [id number] - [place], [place] - [address] [place]  [place] [address] Phone: [phone] Fax: [phone]  [id number] - [place], [place] - [address] [place] [address] Phone: [phone] Fax: [phone]     Social Determinants of Health (SDOH) Interventions    Readmission Risk Interventions     No data to display

## 2022-07-27 NOTE — Consult Note (Addendum)
Urology Consult Note   Requesting Attending Physician:  Synetta Fail, MD Service Providing Consult: Urology  Consulting Attending: Dr. Jettie Pagan   Reason for Consult:  Right ureteral stone  HPI: Rose Williams is seen in consultation for reasons noted above at the request of Synetta Fail, MD for evaluation of right ureteral stone.  This is a 61 y.o. female with autoimmune hepatitis and history of prior kidney stones who presented with right sided flank pain with nausea and vomiting, found to have a right 59mm mid-ureteral stone with upstream hydronephrosis. UA with large leukocyte esterase, pyuria, and bacteria. Afebrile initially but on evaluation febrile to 101.  Tachycardic. No leukocytosis, no AKI.   Past Medical History: Past Medical History:  Diagnosis Date   Autoimmune hepatitis (HCC)    Cirrhosis (HCC)    Hydronephrosis    Jaundice    Kidney stones    Pyelonephritis     Past Surgical History:  Past Surgical History:  Procedure Laterality Date   LITHOTRIPSY     2008    Medication: No current facility-administered medications for this encounter.    Allergies: Allergies  Allergen Reactions   Dairy Aid [Tilactase] Diarrhea and Nausea And Vomiting    Allergic to all dairy products   Gluten Meal Diarrhea and Nausea And Vomiting   Other Itching    Itching from melons, cantaloupe, eggplant   Pseudoephedrine Other (See Comments)    tachycardia   Whey Protein [Protein] Diarrhea and Nausea And Vomiting   Xanthan Gum Other (See Comments)    Nausea/vomiting     Social History: Social History   Tobacco Use   Smoking status: Never   Smokeless tobacco: Never  Vaping Use   Vaping Use: Never used  Substance Use Topics   Alcohol use: No   Drug use: No    Family History Family History  Problem Relation Age of Onset   Cancer Mother        liver cancer, died of liver cancer at age of 11   Cystic fibrosis Child        2 daughter and 1 son    Kidney Stones Other        runging in her family, including 2 brothers and 2 sisters    Review of Systems 10 systems were reviewed and are negative except as noted specifically in the HPI.  Objective   Vital signs in last 24 hours: BP (!) 158/94   Pulse (!) 103   Temp (!) (P) 101.7 F (38.7 C)   Resp 18   Ht 5\' 6"  (1.676 m)   Wt 53.5 kg   SpO2 99%   BMI 19.05 kg/m   Physical Exam General: NAD, A&O, resting, appropriate HEENT: [place]/AT, EOMI, MMM Pulmonary: Normal work of breathing Cardiovascular: HDS, adequate peripheral perfusion Abdomen: Soft, NTTP, nondistended GU: voiding spontaneously, right CVA tenderness Extremities: warm and well perfused Neuro: Appropriate, no focal neurological deficits  Most Recent Labs: Lab Results  Component Value Date   WBC 9.8 07/26/2022   HGB 13.9 07/26/2022   HCT 40.4 07/26/2022   PLT 226 07/26/2022    Lab Results  Component Value Date   NA 133 (L) 07/26/2022   K 3.2 (L) 07/26/2022   CL 97 (L) 07/26/2022   CO2 23 07/26/2022   BUN 10 07/26/2022   CREATININE 0.91 07/26/2022   CALCIUM 9.5 07/26/2022    Lab Results  Component Value Date   INR 1.0 09/15/2014  Urine Culture: @[id number] (laburin,org,[id number],[id number])@   IMAGING: CT Renal Stone Study  Result Date: 07/26/2022 CLINICAL DATA:  Right flank pain, nausea/vomiting/diarrhea EXAM: CT ABDOMEN AND PELVIS WITHOUT CONTRAST TECHNIQUE: Multidetector CT imaging of the abdomen and pelvis was performed following the standard protocol without IV contrast. RADIATION DOSE REDUCTION: This exam was performed according to the departmental dose-optimization program which includes automated exposure control, adjustment of the mA and/or kV according to patient size and/or use of iterative reconstruction technique. COMPARISON:  Right upper quadrant ultrasound dated 01/04/2022. CT abdomen/pelvis dated 06/06/2017. FINDINGS: Lower chest: Lung bases are clear, noting mild linear  scarring/atelectasis at the left lung base. Hepatobiliary: Unenhanced liver is unremarkable. Gallbladder is unremarkable. No intrahepatic or extrahepatic ductal dilatation. Pancreas: Within normal limits. Spleen: Within normal limits been Adrenals/Urinary Tract: Adrenal glands are within normal limits. Left renal cortical scarring/atrophy with mild fullness of the left renal collecting system and multiple nonobstructing left lower pole renal calculi measuring up to 5 mm (series 3/image 32). Right kidney is notable for perirenal edema, multiple nonobstructing calculi measuring up to 5 mm, and severe hydroureteronephrosis with two adjacent proximal ureteral calculi measuring up to 6 mm at the L4 level (coronal image 55). Bladder is within normal limits. Stomach/Bowel: Stomach is within normal limits. No evidence of bowel obstruction. Normal appendix (series 3/image 61). No colonic wall thickening or inflammatory changes. Vascular/Lymphatic: No evidence of abdominal aortic aneurysm. No suspicious abdominopelvic lymphadenopathy. Reproductive: Uterus is within normal limits. Bilateral ovaries are within normal limits. Other: No abdominopelvic ascites. Musculoskeletal: Mild degenerative changes at L3-4. IMPRESSION: Two adjacent proximal right ureteral calculi measuring up to 6 mm at the L4 level. Associated severe right hydroureteronephrosis. Additional bilateral nonobstructing renal calculi measuring up to 5 mm. Left renal cortical scarring/atrophy. Electronically Signed   By: 08/06/2017 M.D.   On: 07/26/2022 20:39    ------  Assessment:  61 y.o. female with right ureteral stone and UTI.   Recommendations: - proceed to OR for right ureteral stent placement - 10-14days culture specific antibiotics for complicated UTI - Following discharge patient will follow-up with urology for definitive stone management in the future -CT also with 36mm RLP and 14mm LLP stones. Will need definitive treatment of stones  outpatient. Will arrange followup.  -The risks, benefits and alternatives of cystoscopy with right JJ stent placement was discussed with the patient.  Risks include, but are not limited to: bleeding, urinary tract infection, ureteral injury, ureteral stricture disease, chronic pain, urinary symptoms, bladder injury, stent migration, the need for nephrostomy tube placement, MI, CVA, DVT, PE and the inherent risks with general anesthesia.  The patient voices understanding and wishes to proceed.     Thank you for this consult. Please contact the urology consult pager with any further questions/concerns.  Matt R. Dartanyan Deasis MD [facility]  Pager: [phone]

## 2022-07-27 NOTE — H&P (Signed)
History and Physical   Rose Williams:[id number] DOB: 1961-09-07 DOA: 07/26/2022  PCP: Patient, No Pcp Per   Patient coming from: Home  Chief Complaint: Right flank pain, decreased p.o. intake, nausea  HPI: [name] is a 61 y.o. female with medical history significant of autoimmune hepatitis, pyelonephritis, renal stones presenting with flank pain nausea and decreased p.o. intake.  Patient reports 2 days of symptoms consisting of flank pain that she states she does not get severe pain like most people.  She also reports associated nausea and decreased p.o. intake which is similar to when she had previous renal stones.  Also reports episode of diaphoresis but no recorded fever.  And she reports some episodes of diarrhea as well.  She denies chills, chest pain, shortness of breath, constipation.  ED Course: Vital signs in the ED significant for heart rate in the 80s to 110s, blood pressure in the 160s 170s systolic.  Lab work-up included CMP with sodium 133, potassium 3.2, chloride 97, glucose 138, protein 8.2.  CBC within normal limits.  Lipase normal.  Urinalysis with hemoglobin, protein, leukocytes, bacteria.  Urine cultures pending.  CT renal stone study showed 2 adjacent ureteral calculi up to 6 mm in diameter.  Other nonobstructing renal calculi noted in the kidney.  Noted also has associated acute severe hydroureter ureter nephrosis.  Patient received ceftriaxone and 2 L of fluid in the ED.  Urology was consulted and wanted patient to be transferred to [facility] for urgent stenting.  Review of Systems: As per HPI otherwise all other systems reviewed and are negative.  Past Medical History:  Diagnosis Date   Autoimmune hepatitis (HCC)    Cirrhosis (HCC)    Hydronephrosis    Jaundice    Kidney stones    Pyelonephritis     Past Surgical History:  Procedure Laterality Date   LITHOTRIPSY     2008    Social History  reports that she has never smoked. She has  never used smokeless tobacco. She reports that she does not drink alcohol and does not use drugs.  Allergies  Allergen Reactions   Dairy Aid [Tilactase] Diarrhea and Nausea And Vomiting    Allergic to all dairy products   Gluten Meal Diarrhea and Nausea And Vomiting   Other Itching    Itching from melons, cantaloupe, eggplant   Pseudoephedrine Other (See Comments)    tachycardia   Whey Protein [Protein] Diarrhea and Nausea And Vomiting   Xanthan Gum Other (See Comments)    Nausea/vomiting     Family History  Problem Relation Age of Onset   Cancer Mother        liver cancer, died of liver cancer at age of 74   Cystic fibrosis Child        2 daughter and 1 son   Kidney Stones Other        runging in her family, including 2 brothers and 2 sisters  Reviewed on admission  Prior to Admission medications   Medication Sig Start Date End Date Taking? Authorizing Provider  azaTHIOprine (IMURAN) 50 MG tablet Take 2 tablets (100 mg total) by mouth daily. 12/19/21   Hilarie Fredrickson, MD    Physical Exam: Vitals:   07/26/22 2230 07/26/22 2318 07/26/22 2354 07/27/22 0000  BP:  (!) 166/97 (!) 158/100 (!) 158/94  Pulse: 85 (!) 110 100 (!) 103  Resp:  20 12 18   Temp: 98.5 F (36.9 C) (!) 101.1 F (38.4 C) (!) 101.7  F (38.7 C)   TempSrc:  Oral    SpO2: 95% 99% 99% 99%  Weight:      Height:        Physical Exam Constitutional:      General: She is not in acute distress.    Appearance: Normal appearance.  HENT:     Head: Normocephalic and atraumatic.     Mouth/Throat:     Mouth: Mucous membranes are moist.     Pharynx: Oropharynx is clear.  Eyes:     Extraocular Movements: Extraocular movements intact.     Pupils: Pupils are equal, round, and reactive to light.  Cardiovascular:     Rate and Rhythm: Normal rate and regular rhythm.     Pulses: Normal pulses.     Heart sounds: Normal heart sounds.  Pulmonary:     Effort: Pulmonary effort is normal. No respiratory distress.      Breath sounds: Normal breath sounds.  Abdominal:     General: Bowel sounds are normal. There is no distension.     Palpations: Abdomen is soft.     Tenderness: There is no abdominal tenderness. There is right CVA tenderness.  Musculoskeletal:        General: No swelling or deformity.  Skin:    General: Skin is warm and dry.  Neurological:     General: No focal deficit present.     Mental Status: Mental status is at baseline.    Labs on Admission: I have personally reviewed following labs and imaging studies  CBC: Recent Labs  Lab 07/26/22 1821  WBC 9.8  NEUTROABS 8.5*  HGB 13.9  HCT 40.4  MCV 95.5  PLT 226    Basic Metabolic Panel: Recent Labs  Lab 07/26/22 1821  NA 133*  K 3.2*  CL 97*  CO2 23  GLUCOSE 138*  BUN 10  CREATININE 0.91  CALCIUM 9.5    GFR: Estimated Creatinine Clearance: 56.2 mL/min (by C-G formula based on SCr of 0.91 mg/dL).  Liver Function Tests: Recent Labs  Lab 07/26/22 1821  AST 12*  ALT 6  ALKPHOS 55  BILITOT 0.8  PROT 8.2*  ALBUMIN 4.2    Urine analysis:    Component Value Date/Time   COLORURINE YELLOW 07/26/2022 1821   APPEARANCEUR HAZY (A) 07/26/2022 1821   LABSPEC <1.005 (L) 07/26/2022 1821   PHURINE 6.5 07/26/2022 1821   GLUCOSEU NEGATIVE 07/26/2022 1821   HGBUR SMALL (A) 07/26/2022 1821   BILIRUBINUR NEGATIVE 07/26/2022 1821   KETONESUR NEGATIVE 07/26/2022 1821   PROTEINUR 30 (A) 07/26/2022 1821   UROBILINOGEN 1.0 09/06/2014 1606   NITRITE NEGATIVE 07/26/2022 1821   LEUKOCYTESUR LARGE (A) 07/26/2022 1821    Radiological Exams on Admission: CT Renal Stone Study  Result Date: 07/26/2022 CLINICAL DATA:  Right flank pain, nausea/vomiting/diarrhea EXAM: CT ABDOMEN AND PELVIS WITHOUT CONTRAST TECHNIQUE: Multidetector CT imaging of the abdomen and pelvis was performed following the standard protocol without IV contrast. RADIATION DOSE REDUCTION: This exam was performed according to the departmental dose-optimization  program which includes automated exposure control, adjustment of the mA and/or kV according to patient size and/or use of iterative reconstruction technique. COMPARISON:  Right upper quadrant ultrasound dated 01/04/2022. CT abdomen/pelvis dated 06/06/2017. FINDINGS: Lower chest: Lung bases are clear, noting mild linear scarring/atelectasis at the left lung base. Hepatobiliary: Unenhanced liver is unremarkable. Gallbladder is unremarkable. No intrahepatic or extrahepatic ductal dilatation. Pancreas: Within normal limits. Spleen: Within normal limits been Adrenals/Urinary Tract: Adrenal glands are within normal limits. Left  renal cortical scarring/atrophy with mild fullness of the left renal collecting system and multiple nonobstructing left lower pole renal calculi measuring up to 5 mm (series 3/image 32). Right kidney is notable for perirenal edema, multiple nonobstructing calculi measuring up to 5 mm, and severe hydroureteronephrosis with two adjacent proximal ureteral calculi measuring up to 6 mm at the L4 level (coronal image 55). Bladder is within normal limits. Stomach/Bowel: Stomach is within normal limits. No evidence of bowel obstruction. Normal appendix (series 3/image 61). No colonic wall thickening or inflammatory changes. Vascular/Lymphatic: No evidence of abdominal aortic aneurysm. No suspicious abdominopelvic lymphadenopathy. Reproductive: Uterus is within normal limits. Bilateral ovaries are within normal limits. Other: No abdominopelvic ascites. Musculoskeletal: Mild degenerative changes at L3-4. IMPRESSION: Two adjacent proximal right ureteral calculi measuring up to 6 mm at the L4 level. Associated severe right hydroureteronephrosis. Additional bilateral nonobstructing renal calculi measuring up to 5 mm. Left renal cortical scarring/atrophy. Electronically Signed   By: Charline Bills M.D.   On: 07/26/2022 20:39    EKG: Not performed in the emergency department.  Assessment/Plan Principal  Problem:   Obstruction of right ureter Active Problems:   Autoimmune hepatitis (HCC)   UTI (urinary tract infection)   Right ureteral obstruction > Patient coming in with right flank pain with history of similar.  Found to have acute severe right hydroureteronephrosis secondary to 2 adjacent ureteral stones measuring up to 6 mm in diameter. > Urology consulted from the ED and recommended transfer to [facility] for urgent stenting. - Monitor on telemetry - Appreciate urology recommendations - Patient is going to the OR this evening - Received 2 L IV fluids - Pain control as needed - Rest per urology  UTI > Patient with urinalysis consistent with UTI possibly infected stone.  Positive for hemoglobin, protein, leukocytes, bacteria. - Started on ceftriaxone, will continue this for now - Follow-up urine cultures  Autoimmune hepatitis > History of this undergoing treatment days of prior therapy and with good LFTs. - Continue home azathioprine  Hypokalemia - Mild hypokalemia at 3.2 in ED. - 40 mEq p.o. potassium - Check magnesium  DVT prophylaxis: SCDs periprocedurally Code Status:   Full Family Communication:  Updated at bedside Disposition Plan:   Patient is from:  Home  Anticipated DC to:  Home  Anticipated DC date:  2 days  Anticipated DC barriers: None  Consults called:  Urology, consulted in the ED, have taken the patient for procedure Admission status:  Inpatient, progressive  Severity of Illness: The appropriate patient status for this patient is INPATIENT. Inpatient status is judged to be reasonable and necessary in order to provide the required intensity of service to ensure the patient's safety. The patient's presenting symptoms, physical exam findings, and initial radiographic and laboratory data in the context of their chronic comorbidities is felt to place them at high risk for further clinical deterioration. Furthermore, it is not anticipated that the patient will  be medically stable for discharge from the hospital within 2 midnights of admission.   * I certify that at the point of admission it is my clinical judgment that the patient will require inpatient hospital care spanning beyond 2 midnights from the point of admission due to high intensity of service, high risk for further deterioration and high frequency of surveillance required.Synetta Fail MD [facility]  How to contact the [facility] Attending or Consulting provider 7A - 7P or covering provider during after hours 7P -7A, for this patient?  Check the care team in [facility] and look for a) attending/consulting TRH provider listed and b) the [facility] team listed Log into [email or web address] and use [place]'s universal password to access. If you do not have the password, please contact the hospital operator. Locate the [facility] provider you are looking for under [facility] and page to a number that you can be directly reached. If you still have difficulty reaching the provider, please page the [facility] (Director on Call) for the Hospitalists listed on amion for assistance.  07/27/2022, 12:05 AM

## 2022-07-27 NOTE — Progress Notes (Signed)
PROGRESS NOTE    Rose Williams  QQI:[id number] DOB: 09/09/1961 DOA: 07/26/2022 PCP: Patient, No Pcp Per   Brief Narrative: 61 year old with past medical history significant for out immune hepatitis, pyelonephritis, renal stone presented with flank pain, nausea and decreased oral intake for the last 2 days.  Urinalysis consistent with UTI with leukocytes and bacteriuria, CT renal stone showed 2 adjacent ureteral calculi up to 6 mm in diameter.  Other nonobstructing renal calculi noted in the kidney.  Associated severe right hydroureteronephrosis.  Urology was consulted, patient underwent cystoscopy and right retrograde pyelogram, right ureteral stent placement on 7/28.     Assessment & Plan:   Principal Problem:   Obstruction of right ureter Active Problems:   Autoimmune hepatitis (HCC)   UTI (urinary tract infection)   Hypokalemia   1-Right ureteral obstruction: Patient presenting with flank pain, nausea.  CT scan consistent with severe right ureteral nephrosis secondary to 2 adjacent ureteral stone measuring up to 6 mm in diameters. Patient underwent cystoscopy and ureteral stent placement on 7/28 Plan to continue with IV fluids and follow urine culture  2-Pyelonephritis: Continue with IV fluids Continue with IV antibiotic Follow urine culture Urology  recommends 10 to 14 days of antibiotic  3-history of Autoimmune hepatitis On azathioprine  Hypokalemia: Replace      Estimated body mass index is 19.05 kg/m as calculated from the following:   Height as of this encounter: 5\' 6"  (1.676 m).   Weight as of this encounter: 53.5 kg.   DVT prophylaxis: SCD Code Status: Full code Family Communication: Care discussed with patient.  Disposition Plan:  Status is: Inpatient Remains inpatient appropriate because: remain in hospital for IV fluids, IV antibiotics    Consultants:  Urology  Procedures:  Cystoscopy stent placement  Antimicrobials:     Subjective: Report feeling much better today.   Objective: Vitals:   07/27/22 0145 07/27/22 0333 07/27/22 0757 07/27/22 1213  BP: (!) 148/102 137/89 (!) 152/97 (!) 144/97  Pulse: 92 91 91 96  Resp: 16 14 20 16   Temp: 99.2 F (37.3 C) 98.6 F (37 C) 97.9 F (36.6 C) 97.7 F (36.5 C)  TempSrc: Oral Oral  Oral  SpO2: 99% 98% 100% 100%  Weight:      Height:        Intake/Output Summary (Last 24 hours) at 07/27/2022 1351 Last data filed at 07/27/2022 0700 Gross per 24 hour  Intake 1675.8 ml  Output 1825 ml  Net -149.2 ml   Filed Weights   07/26/22 1818  Weight: 53.5 kg    Examination:  General exam: Appears calm and comfortable  Respiratory system: Clear to auscultation. Respiratory effort normal. Cardiovascular system: S1 & S2 heard, RRR. No JVD, murmurs, rubs, gallops or clicks. No pedal edema. Gastrointestinal system: Abdomen is nondistended, soft and nontender. No organomegaly or masses felt. Normal bowel sounds heard. Central nervous system: Alert and oriented. No focal neurological deficits. Extremities: Symmetric 5 x 5 power.   Data Reviewed: I have personally reviewed following labs and imaging studies  CBC: Recent Labs  Lab 07/26/22 1821 07/27/22 0334  WBC 9.8 10.2  NEUTROABS 8.5*  --   HGB 13.9 12.6  HCT 40.4 36.6  MCV 95.5 96.1  PLT 226 190   Basic Metabolic Panel: Recent Labs  Lab 07/26/22 1821 07/27/22 0334  NA 133* 144  K 3.2* 5.1  CL 97* 111  CO2 23 23  GLUCOSE 138* 179*  BUN 10 9  CREATININE 0.91 0.78  CALCIUM  9.5 8.8*  MG  --  2.0   GFR: Estimated Creatinine Clearance: 63.9 mL/min (by C-G formula based on SCr of 0.78 mg/dL). Liver Function Tests: Recent Labs  Lab 07/26/22 1821 07/27/22 0334  AST 12* 13*  ALT 6 8  ALKPHOS 55 52  BILITOT 0.8 0.5  PROT 8.2* 7.1  ALBUMIN 4.2 3.1*   Recent Labs  Lab 07/26/22 1821  LIPASE 14   No results for input(s): "AMMONIA" in the last 168 hours. Coagulation Profile: No results  for input(s): "INR", "PROTIME" in the last 168 hours. Cardiac Enzymes: No results for input(s): "CKTOTAL", "CKMB", "CKMBINDEX", "TROPONINI" in the last 168 hours. BNP (last 3 results) No results for input(s): "PROBNP" in the last 8760 hours. HbA1C: No results for input(s): "HGBA1C" in the last 72 hours. CBG: No results for input(s): "GLUCAP" in the last 168 hours. Lipid Profile: No results for input(s): "CHOL", "HDL", "LDLCALC", "TRIG", "CHOLHDL", "LDLDIRECT" in the last 72 hours. Thyroid Function Tests: No results for input(s): "TSH", "T4TOTAL", "FREET4", "T3FREE", "THYROIDAB" in the last 72 hours. Anemia Panel: No results for input(s): "VITAMINB12", "FOLATE", "FERRITIN", "TIBC", "IRON", "RETICCTPCT" in the last 72 hours. Sepsis Labs: No results for input(s): "PROCALCITON", "LATICACIDVEN" in the last 168 hours.  Recent Results (from the past 240 hour(s))  MRSA Next Gen by PCR, Nasal     Status: None   Collection Time: 07/26/22 11:22 PM   Specimen: Nasal Mucosa; Nasal Swab  Result Value Ref Range Status   MRSA by PCR Next Gen NOT DETECTED NOT DETECTED Final    Comment: (NOTE) The GeneXpert MRSA Assay (FDA approved for NASAL specimens only), is one component of a comprehensive MRSA colonization surveillance program. It is not intended to diagnose MRSA infection nor to guide or monitor treatment for MRSA infections. Test performance is not FDA approved in patients less than 32 years old. Performed at [facility], [address] [place], [place] [address]          Radiology Studies: DG C-Arm 1-60 Min-No Report  Result Date: 07/27/2022 Fluoroscopy was utilized by the requesting physician.  No radiographic interpretation.   CT Renal Stone Study  Result Date: 07/26/2022 CLINICAL DATA:  Right flank pain, nausea/vomiting/diarrhea EXAM: CT ABDOMEN AND PELVIS WITHOUT CONTRAST TECHNIQUE: Multidetector CT imaging of the abdomen and pelvis was performed following  the standard protocol without IV contrast. RADIATION DOSE REDUCTION: This exam was performed according to the departmental dose-optimization program which includes automated exposure control, adjustment of the mA and/or kV according to patient size and/or use of iterative reconstruction technique. COMPARISON:  Right upper quadrant ultrasound dated 01/04/2022. CT abdomen/pelvis dated 06/06/2017. FINDINGS: Lower chest: Lung bases are clear, noting mild linear scarring/atelectasis at the left lung base. Hepatobiliary: Unenhanced liver is unremarkable. Gallbladder is unremarkable. No intrahepatic or extrahepatic ductal dilatation. Pancreas: Within normal limits. Spleen: Within normal limits been Adrenals/Urinary Tract: Adrenal glands are within normal limits. Left renal cortical scarring/atrophy with mild fullness of the left renal collecting system and multiple nonobstructing left lower pole renal calculi measuring up to 5 mm (series 3/image 32). Right kidney is notable for perirenal edema, multiple nonobstructing calculi measuring up to 5 mm, and severe hydroureteronephrosis with two adjacent proximal ureteral calculi measuring up to 6 mm at the L4 level (coronal image 55). Bladder is within normal limits. Stomach/Bowel: Stomach is within normal limits. No evidence of bowel obstruction. Normal appendix (series 3/image 61). No colonic wall thickening or inflammatory changes. Vascular/Lymphatic: No evidence of abdominal aortic aneurysm.  No suspicious abdominopelvic lymphadenopathy. Reproductive: Uterus is within normal limits. Bilateral ovaries are within normal limits. Other: No abdominopelvic ascites. Musculoskeletal: Mild degenerative changes at L3-4. IMPRESSION: Two adjacent proximal right ureteral calculi measuring up to 6 mm at the L4 level. Associated severe right hydroureteronephrosis. Additional bilateral nonobstructing renal calculi measuring up to 5 mm. Left renal cortical scarring/atrophy. Electronically  Signed   By: Charline Bills M.D.   On: 07/26/2022 20:39        Scheduled Meds:  azaTHIOprine  100 mg Oral Daily   sodium chloride flush  3 mL Intravenous Q12H   Continuous Infusions:  cefTRIAXone (ROCEPHIN)  IV       LOS: 1 day    Time spent: 35 minutes    Mathew Storck A Tarron Krolak, MD [facility]   If 7PM-7AM, please contact night-coverage [email or web address]  07/27/2022, 1:51 PM

## 2022-07-28 DIAGNOSIS — N135 Crossing vessel and stricture of ureter without hydronephrosis: Secondary | ICD-10-CM | POA: Diagnosis not present

## 2022-07-28 LAB — CBC
HCT: 37.3 % (ref 36.0–46.0)
Hemoglobin: 12.7 g/dL (ref 12.0–15.0)
MCH: 33.4 pg (ref 26.0–34.0)
MCHC: 34 g/dL (ref 30.0–36.0)
MCV: 98.2 fL (ref 80.0–100.0)
Platelets: 226 10*3/uL (ref 150–400)
RBC: 3.8 MIL/uL — ABNORMAL LOW (ref 3.87–5.11)
RDW: 13.1 % (ref 11.5–15.5)
WBC: 8.2 10*3/uL (ref 4.0–10.5)
nRBC: 0 % (ref 0.0–0.2)

## 2022-07-28 LAB — BASIC METABOLIC PANEL
Anion gap: 9 (ref 5–15)
BUN: 12 mg/dL (ref 6–20)
CO2: 25 mmol/L (ref 22–32)
Calcium: 8.6 mg/dL — ABNORMAL LOW (ref 8.9–10.3)
Chloride: 109 mmol/L (ref 98–111)
Creatinine, Ser: 0.82 mg/dL (ref 0.44–1.00)
GFR, Estimated: >60 mL/min (ref >60)
Glucose, Bld: 107 mg/dL — ABNORMAL HIGH (ref 70–99)
Potassium: 3.6 mmol/L (ref 3.5–5.1)
Sodium: 143 mmol/L (ref 135–145)

## 2022-07-28 MED ORDER — CEFDINIR 300 MG PO CAPS: 300.0000 mg | ORAL_CAPSULE | Freq: Two times a day (BID) | ORAL | 0 refills | Status: AC

## 2022-07-28 NOTE — Discharge Summary (Signed)
Physician Discharge Summary   Patient: Rose Williams MRN: [id number] DOB: 01/24/1961  Admit date:     07/26/2022  Discharge date: 07/28/22  Discharge Physician: Alba Cory   PCP: Patient, No Pcp Per   Recommendations at discharge:    Needs follow up with urology for further care of urolithiasis and removal of stent.  Final urine culture will need to be follow up.   Discharge Diagnoses: Principal Problem:   Obstruction of right ureter Active Problems:   Autoimmune hepatitis (HCC)   UTI (urinary tract infection)   Hypokalemia  Resolved Problems:   * No resolved hospital problems. *  Hospital Course: 61 year old with past medical history significant for Autoimmune hepatitis, pyelonephritis, renal stone presented with flank pain, nausea and decreased oral intake for the last 2 days.   Urinalysis consistent with UTI with leukocytes and bacteriuria, CT renal stone showed 2 adjacent ureteral calculi up to 6 mm in diameter.  Other nonobstructing renal calculi noted in the kidney.  Associated severe right hydroureteronephrosis.   Urology was consulted, patient underwent cystoscopy and right retrograde pyelogram, right ureteral stent placement on 7/28.  Patient was treated with IV fluids and IV antibiotics.  She will be discharged on cefdinir for 12 more days.  Final urine culture report will need to be final, to ascertain  right antibiotic coverage.    Assessment and Plan: 1-Right ureteral obstruction: Patient presenting with flank pain, nausea.  CT scan consistent with severe right ureteral nephrosis secondary to 2 adjacent ureteral stone measuring up to 6 mm in diameters. Patient underwent cystoscopy and ureteral stent placement on 7/28 Patient treated with IV fluids and IV ceftriaxone.  Urine culture; growing gram negative rods, E coli.  She will be discharge on cefdinir for 12 more days.    2-Pyelonephritis: Treated  with IV fluids. Treated  with IV  antibiotic. Urine culture; growing E coli. Sensitivity pending.  Urology  recommends 10 to 14 days of antibiotic. She will be discharge on Cefdinir. Patient aware that antibiotics might need to be change depending on sensitivity.    3-History of Autoimmune hepatitis On azathioprine   Hypokalemia: Replaced            Consultants: Dr Cardell Peach Procedures performed: Cystoscopy ureteral stent placement 7/28 Disposition: Home Diet recommendation:  Discharge Diet Orders (From admission, onward)     Start     Ordered   07/28/22 0000  Diet - low sodium heart healthy        07/28/22 1135           Regular diet DISCHARGE MEDICATION: Allergies as of 07/28/2022       Reactions   Dairy Aid [tilactase] Diarrhea, Nausea And Vomiting   Allergic to all dairy products   Gluten Meal Diarrhea, Nausea And Vomiting   Other Itching   Itching from melons, cantaloupe, eggplant   Pseudoephedrine Other (See Comments)   tachycardia   Whey Protein [protein] Diarrhea, Nausea And Vomiting   Xanthan Gum Other (See Comments)   Nausea/vomiting         Medication List     TAKE these medications    azaTHIOprine 50 MG tablet Commonly known as: IMURAN Take 2 tablets (100 mg total) by mouth daily.   cefdinir 300 MG capsule Commonly known as: OMNICEF Take 1 capsule (300 mg total) by mouth 2 (two) times daily for 12 days.        Follow-up Information     [facility]. Schedule an appointment as  soon as possible for a visit.   Contact information: [address] [place] [address] [place] [address] [phone]               Discharge Exam: Ceasar Mons Weights   07/26/22 1818  Weight: 53.5 kg   General; NAD , very pleasant.    Condition at discharge: stable  The results of significant diagnostics from this hospitalization (including imaging, microbiology, ancillary and laboratory) are listed below for reference.   Imaging Studies: DG C-Arm 1-60 Min-No  Report  Result Date: 07/27/2022 Fluoroscopy was utilized by the requesting physician.  No radiographic interpretation.   CT Renal Stone Study  Result Date: 07/26/2022 CLINICAL DATA:  Right flank pain, nausea/vomiting/diarrhea EXAM: CT ABDOMEN AND PELVIS WITHOUT CONTRAST TECHNIQUE: Multidetector CT imaging of the abdomen and pelvis was performed following the standard protocol without IV contrast. RADIATION DOSE REDUCTION: This exam was performed according to the departmental dose-optimization program which includes automated exposure control, adjustment of the mA and/or kV according to patient size and/or use of iterative reconstruction technique. COMPARISON:  Right upper quadrant ultrasound dated 01/04/2022. CT abdomen/pelvis dated 06/06/2017. FINDINGS: Lower chest: Lung bases are clear, noting mild linear scarring/atelectasis at the left lung base. Hepatobiliary: Unenhanced liver is unremarkable. Gallbladder is unremarkable. No intrahepatic or extrahepatic ductal dilatation. Pancreas: Within normal limits. Spleen: Within normal limits been Adrenals/Urinary Tract: Adrenal glands are within normal limits. Left renal cortical scarring/atrophy with mild fullness of the left renal collecting system and multiple nonobstructing left lower pole renal calculi measuring up to 5 mm (series 3/image 32). Right kidney is notable for perirenal edema, multiple nonobstructing calculi measuring up to 5 mm, and severe hydroureteronephrosis with two adjacent proximal ureteral calculi measuring up to 6 mm at the L4 level (coronal image 55). Bladder is within normal limits. Stomach/Bowel: Stomach is within normal limits. No evidence of bowel obstruction. Normal appendix (series 3/image 61). No colonic wall thickening or inflammatory changes. Vascular/Lymphatic: No evidence of abdominal aortic aneurysm. No suspicious abdominopelvic lymphadenopathy. Reproductive: Uterus is within normal limits. Bilateral ovaries are within normal  limits. Other: No abdominopelvic ascites. Musculoskeletal: Mild degenerative changes at L3-4. IMPRESSION: Two adjacent proximal right ureteral calculi measuring up to 6 mm at the L4 level. Associated severe right hydroureteronephrosis. Additional bilateral nonobstructing renal calculi measuring up to 5 mm. Left renal cortical scarring/atrophy. Electronically Signed   By: Charline Bills M.D.   On: 07/26/2022 20:39    Microbiology: Results for orders placed or performed during the hospital encounter of 07/26/22  Urine Culture     Status: Abnormal (Preliminary result)   Collection Time: 07/26/22  6:21 PM   Specimen: Urine, Clean Catch  Result Value Ref Range Status   Specimen Description   Final    URINE, CLEAN CATCH Performed at [facility], [address] [place], [place] [address]    Special Requests   Final    NONE Performed at [facility], [address], [place], [place] [address]    Culture >=100,000 COLONIES/mL ESCHERICHIA COLI (A)  Final   Report Status PENDING  Incomplete  MRSA Next Gen by PCR, Nasal     Status: None   Collection Time: 07/26/22 11:22 PM   Specimen: Nasal Mucosa; Nasal Swab  Result Value Ref Range Status   MRSA by PCR Next Gen NOT DETECTED NOT DETECTED Final    Comment: (NOTE) The GeneXpert MRSA Assay (FDA approved for NASAL specimens only), is one component of a comprehensive MRSA colonization surveillance program.  It is not intended to diagnose MRSA infection nor to guide or monitor treatment for MRSA infections. Test performance is not FDA approved in patients less than 72 years old. Performed at [facility], [address], [place], [place] [address]   Urine Culture     Status: Abnormal (Preliminary result)   Collection Time: 07/27/22 12:45 AM   Specimen: Urine, Cystoscope  Result Value Ref Range Status   Specimen Description   Final    CYSTOSCOPY URINE CYSTOSCOPE RIGHT RENAL  PELVIS Performed at [facility], [address] [place], [place] [address]    Special Requests   Final    RIGHT RENAL PELVIS Performed at [facility], [address] [place], [place] [address]    Culture 5,000 COLONIES/mL ESCHERICHIA COLI (A)  Final   Report Status PENDING  Incomplete    Labs: CBC: Recent Labs  Lab 07/26/22 1821 07/27/22 0334 07/28/22 0729  WBC 9.8 10.2 8.2  NEUTROABS 8.5*  --   --   HGB 13.9 12.6 12.7  HCT 40.4 36.6 37.3  MCV 95.5 96.1 98.2  PLT 226 190 226   Basic Metabolic Panel: Recent Labs  Lab 07/26/22 1821 07/27/22 0334 07/28/22 0729  NA 133* 144 143  K 3.2* 5.1 3.6  CL 97* 111 109  CO2 23 23 25   GLUCOSE 138* 179* 107*  BUN 10 9 12   CREATININE 0.91 0.78 0.82  CALCIUM 9.5 8.8* 8.6*  MG  --  2.0  --    Liver Function Tests: Recent Labs  Lab 07/26/22 1821 07/27/22 0334  AST 12* 13*  ALT 6 8  ALKPHOS 55 52  BILITOT 0.8 0.5  PROT 8.2* 7.1  ALBUMIN 4.2 3.1*   CBG: No results for input(s): "GLUCAP" in the last 168 hours.  Discharge time spent: greater than 30 minutes.  Signed: 07/28/22, MD [facility] 07/28/2022

## 2022-07-29 LAB — URINE CULTURE
Culture: 5000 — AB
Culture: >=100000 — AB

## 2022-07-31 ENCOUNTER — Other Ambulatory Visit: Payer: Self-pay | Admitting: Urology

## 2022-08-03 NOTE — Anesthesia Postprocedure Evaluation (Signed)
Anesthesia Post Note  Patient: Rose Williams  Procedure(s) Performed: CYSTOSCOPY WITH RETROGRADE PYELOGRAM/URETERAL STENT PLACEMENT, AND URETEROSCOPY (Right: Ureter)     Patient location during evaluation: PACU Anesthesia Type: General Level of consciousness: awake and alert Pain management: pain level controlled Vital Signs Assessment: post-procedure vital signs reviewed and stable Respiratory status: spontaneous breathing, nonlabored ventilation, respiratory function stable and patient connected to nasal cannula oxygen Cardiovascular status: blood pressure returned to baseline and stable Postop Assessment: no apparent nausea or vomiting Anesthetic complications: no   No notable events documented.  Last Vitals:  Vitals:   07/27/22 2113 07/28/22 0516  BP: (!) 146/97 (!) 142/99  Pulse: (!) 115 (!) 114  Resp: 18   Temp: 36.7 C 36.6 C  SpO2: 100% 100%    Last Pain:  Vitals:   07/28/22 1200  TempSrc:   PainSc: 0-No pain                 Takoda Siedlecki

## 2022-08-07 NOTE — Patient Instructions (Signed)
DUE TO COVID-19 ONLY TWO VISITORS  (aged 60 and older)  ARE ALLOWED TO COME WITH YOU AND STAY IN THE WAITING ROOM ONLY DURING PRE OP AND PROCEDURE.   **NO VISITORS ARE ALLOWED IN THE SHORT STAY AREA OR RECOVERY ROOM!!**  IF YOU WILL BE ADMITTED INTO THE HOSPITAL YOU ARE ALLOWED ONLY FOUR SUPPORT PEOPLE DURING VISITATION HOURS ONLY (7 AM -8PM)   The support person(s) must pass our screening, gel in and out, and wear a mask at all times, including in the patient's room. Patients must also wear a mask when staff or their support person are in the room. Visitors GUEST BADGE MUST BE WORN VISIBLY  One adult visitor may remain with you overnight and MUST be in the room by 8 P.M.     Your procedure is scheduled on: 08/17/22   Report to [facility] Main Entrance    Report to admitting at: 6:45 AM   Call this number if you have problems the morning of surgery [phone]   Do not eat food :After Midnight.   After Midnight you may have the following liquids until : 6:00 AM DAY OF SURGERY  Water Black Coffee (sugar ok, NO MILK/CREAM OR CREAMERS)  Tea (sugar ok, NO MILK/CREAM OR CREAMERS) regular and decaf                             Plain Jell-O (NO RED)                                           Fruit ices (not with fruit pulp, NO RED)                                     Popsicles (NO RED)                                                                  Juice: apple, WHITE grape, WHITE cranberry Sports drinks like Gatorade (NO RED)             FOLLOW BOWEL PREP AND ANY ADDITIONAL PRE OP INSTRUCTIONS YOU RECEIVED FROM YOUR SURGEON'S OFFICE!!!   Oral Hygiene is also important to reduce your risk of infection.                                    Remember - BRUSH YOUR TEETH THE MORNING OF SURGERY WITH YOUR REGULAR TOOTHPASTE   Do NOT smoke after Midnight   Take these medicines the morning of surgery with A SIP OF WATER: N/A  DO NOT TAKE ANY ORAL DIABETIC MEDICATIONS DAY OF YOUR  SURGERY  Bring CPAP mask and tubing day of surgery.                              You may not have any metal on your body including hair pins, jewelry, and body piercing  Do not wear make-up, lotions, powders, perfumes/cologne, or deodorant  Do not wear nail polish including gel and S&S, artificial/acrylic nails, or any other type of covering on natural nails including finger and toenails. If you have artificial nails, gel coating, etc. that needs to be removed by a nail salon please have this removed prior to surgery or surgery may need to be canceled/ delayed if the surgeon/ anesthesia feels like they are unable to be safely monitored.   Do not shave  48 hours prior to surgery.    Do not bring valuables to the hospital. [place].   Contacts, dentures or bridgework may not be worn into surgery.   Bring small overnight bag day of surgery.   DO NOT [place]. PHARMACY WILL DISPENSE MEDICATIONS LISTED ON YOUR MEDICATION LIST TO YOU DURING YOUR ADMISSION [place]!    Patients discharged on the day of surgery will not be allowed to drive home.  Someone NEEDS to stay with you for the first 24 hours after anesthesia.   Special Instructions: Bring a copy of your healthcare power of attorney and living will documents         the day of surgery if you haven't scanned them before.              Please read over the following fact sheets you were given: IF YOU HAVE QUESTIONS ABOUT YOUR PRE-OP INSTRUCTIONS PLEASE CALL [phone]     [facility] - Preparing for Surgery Before surgery, you can play an important role.  Because skin is not sterile, your skin needs to be as free of germs as possible.  You can reduce the number of germs on your skin by washing with CHG (chlorahexidine gluconate) soap before surgery.  CHG is an antiseptic cleaner which kills germs and bonds with the skin to continue  killing germs even after washing. Please DO NOT use if you have an allergy to CHG or antibacterial soaps.  If your skin becomes reddened/irritated stop using the CHG and inform your nurse when you arrive at Short Stay. Do not shave (including legs and underarms) for at least 48 hours prior to the first CHG shower.  You may shave your face/neck. Please follow these instructions carefully:  1.  Shower with CHG Soap the night before surgery and the  morning of Surgery.  2.  If you choose to wash your hair, wash your hair first as usual with your  normal  shampoo.  3.  After you shampoo, rinse your hair and body thoroughly to remove the  shampoo.                           4.  Use CHG as you would any other liquid soap.  You can apply chg directly  to the skin and wash                       Gently with a scrungie or clean washcloth.  5.  Apply the CHG Soap to your body ONLY FROM THE NECK DOWN.   Do not use on face/ open                           Wound or open sores. Avoid contact with  eyes, ears mouth and genitals (private parts).                       Wash face,  Genitals (private parts) with your normal soap.             6.  Wash thoroughly, paying special attention to the area where your surgery  will be performed.  7.  Thoroughly rinse your body with warm water from the neck down.  8.  DO NOT shower/wash with your normal soap after using and rinsing off  the CHG Soap.                9.  Pat yourself dry with a clean towel.            10.  Wear clean pajamas.            11.  Place clean sheets on your bed the night of your first shower and do not  sleep with pets. Day of Surgery : Do not apply any lotions/deodorants the morning of surgery.  Please wear clean clothes to the hospital/surgery center.  FAILURE TO FOLLOW THESE INSTRUCTIONS MAY RESULT IN THE CANCELLATION OF YOUR SURGERY PATIENT SIGNATURE_________________________________  NURSE  SIGNATURE__________________________________  ________________________________________________________________________

## 2022-08-08 ENCOUNTER — Other Ambulatory Visit: Payer: Self-pay

## 2022-08-08 ENCOUNTER — Encounter (HOSPITAL_COMMUNITY)
Admission: RE | Admit: 2022-08-08 | Discharge: 2022-08-08 | Disposition: A | Discharge status: Home / Self Care | Payer: 59 | Source: Ambulatory Visit | Attending: Urology | Admitting: Urology

## 2022-08-08 ENCOUNTER — Encounter (HOSPITAL_COMMUNITY): Payer: Self-pay

## 2022-08-08 VITALS — BP 184/99 | Temp 97.8°F | Ht 66.0 in | Wt 114.0 lb

## 2022-08-08 DIAGNOSIS — Z01818 Encounter for other preprocedural examination: Secondary | ICD-10-CM

## 2022-08-08 DIAGNOSIS — Z01812 Encounter for preprocedural laboratory examination: Secondary | ICD-10-CM | POA: Insufficient documentation

## 2022-08-08 HISTORY — DX: Anxiety disorder, unspecified: F41.9

## 2022-08-08 LAB — CBC
HCT: 41.3 % (ref 36.0–46.0)
Hemoglobin: 13.4 g/dL (ref 12.0–15.0)
MCH: 32.9 pg (ref 26.0–34.0)
MCHC: 32.4 g/dL (ref 30.0–36.0)
MCV: 101.5 fL — ABNORMAL HIGH (ref 80.0–100.0)
Platelets: 343 10*3/uL (ref 150–400)
RBC: 4.07 MIL/uL (ref 3.87–5.11)
RDW: 13.1 % (ref 11.5–15.5)
WBC: 4.9 10*3/uL (ref 4.0–10.5)
nRBC: 0 % (ref 0.0–0.2)

## 2022-08-08 NOTE — Progress Notes (Signed)
For Short Stay: COVID SWAB appointment date: Date of COVID positive in last 90 days:  Bowel Prep reminder:   For Anesthesia: PCP - No PCP Cardiologist -   Chest x-ray -  EKG -  Stress Test -  ECHO -  Cardiac Cath -  Pacemaker/ICD device last checked: Pacemaker orders received: Device Rep notified:  Spinal Cord Stimulator:  Sleep Study -  CPAP -   Fasting Blood Sugar -  Checks Blood Sugar _____ times a day Date and result of last Hgb A1c-  Blood Thinner Instructions: Aspirin Instructions: Last Dose:  Activity level: Can go up a flight of stairs and activities of daily living without stopping and without chest pain and/or shortness of breath   Able to exercise without chest pain and/or shortness of breath   Unable to go up a flight of stairs without chest pain and/or shortness of breath     Anesthesia review:   Patient denies shortness of breath, fever, cough and chest pain at PAT appointment   Patient verbalized understanding of instructions that were given to them at the PAT appointment. Patient was also instructed that they will need to review over the PAT instructions again at home before surgery.  

## 2022-08-09 DIAGNOSIS — N202 Calculus of kidney with calculus of ureter: Secondary | ICD-10-CM | POA: Diagnosis not present

## 2022-08-17 ENCOUNTER — Ambulatory Visit (HOSPITAL_COMMUNITY): Payer: 59 | Admitting: Anesthesiology

## 2022-08-17 ENCOUNTER — Ambulatory Visit (HOSPITAL_COMMUNITY): Payer: 59

## 2022-08-17 ENCOUNTER — Encounter (HOSPITAL_COMMUNITY)
Admission: RE | Disposition: A | Discharge status: Home / Self Care | Payer: Self-pay | Source: Home / Self Care | Attending: Urology

## 2022-08-17 ENCOUNTER — Ambulatory Visit (HOSPITAL_BASED_OUTPATIENT_CLINIC_OR_DEPARTMENT_OTHER): Payer: 59 | Admitting: Anesthesiology

## 2022-08-17 ENCOUNTER — Encounter (HOSPITAL_COMMUNITY): Payer: Self-pay | Admitting: Urology

## 2022-08-17 ENCOUNTER — Ambulatory Visit (HOSPITAL_COMMUNITY)
Admission: RE | Admit: 2022-08-17 | Discharge: 2022-08-17 | Disposition: A | Discharge status: Home / Self Care | Payer: 59 | Attending: Urology | Admitting: Urology

## 2022-08-17 DIAGNOSIS — K7460 Unspecified cirrhosis of liver: Secondary | ICD-10-CM | POA: Insufficient documentation

## 2022-08-17 DIAGNOSIS — N132 Hydronephrosis with renal and ureteral calculous obstruction: Secondary | ICD-10-CM | POA: Insufficient documentation

## 2022-08-17 DIAGNOSIS — N202 Calculus of kidney with calculus of ureter: Secondary | ICD-10-CM

## 2022-08-17 DIAGNOSIS — N200 Calculus of kidney: Secondary | ICD-10-CM

## 2022-08-17 HISTORY — PX: CYSTOSCOPY/URETEROSCOPY/HOLMIUM LASER/STENT PLACEMENT: SHX6546

## 2022-08-17 SURGERY — CYSTOSCOPY/URETEROSCOPY/HOLMIUM LASER/STENT PLACEMENT
Anesthesia: General | Site: Ureter | Laterality: Bilateral

## 2022-08-17 MED ORDER — LIDOCAINE 2% (20 MG/ML) 5 ML SYRINGE
INTRAMUSCULAR | Status: AC
  Filled 2022-08-17: qty 5

## 2022-08-17 MED ORDER — LACTATED RINGERS IV SOLN: INTRAVENOUS | Status: DC

## 2022-08-17 MED ORDER — IOHEXOL 300 MG/ML  SOLN
INTRAMUSCULAR | Status: DC | PRN
  Administered 2022-08-17: 17 mL

## 2022-08-17 MED ORDER — ONDANSETRON HCL 4 MG/2ML IJ SOLN
INTRAMUSCULAR | Status: AC
  Filled 2022-08-17: qty 2

## 2022-08-17 MED ORDER — AMISULPRIDE (ANTIEMETIC) 5 MG/2ML IV SOLN: 10.0000 mg | Freq: Once | INTRAVENOUS | Status: DC | PRN

## 2022-08-17 MED ORDER — PROMETHAZINE HCL 25 MG/ML IJ SOLN: 6.2500 mg | INTRAMUSCULAR | Status: DC | PRN

## 2022-08-17 MED ORDER — DEXMEDETOMIDINE (PRECEDEX) IN NS 20 MCG/5ML (4 MCG/ML) IV SYRINGE
PREFILLED_SYRINGE | INTRAVENOUS | Status: DC | PRN
  Administered 2022-08-17: 12 ug via INTRAVENOUS

## 2022-08-17 MED ORDER — GENTAMICIN SULFATE 40 MG/ML IJ SOLN
5.0000 mg/kg | INTRAVENOUS | Status: AC
  Administered 2022-08-17: 258.4 mg via INTRAVENOUS
  Filled 2022-08-17: qty 6.5

## 2022-08-17 MED ORDER — PROPOFOL 10 MG/ML IV BOLUS
INTRAVENOUS | Status: AC
Start: 2022-08-17 — End: ?
  Filled 2022-08-17: qty 20

## 2022-08-17 MED ORDER — LIDOCAINE HCL (CARDIAC) PF 100 MG/5ML IV SOSY
PREFILLED_SYRINGE | INTRAVENOUS | Status: DC | PRN
  Administered 2022-08-17: 60 mg via INTRAVENOUS

## 2022-08-17 MED ORDER — EPHEDRINE SULFATE (PRESSORS) 50 MG/ML IJ SOLN
INTRAMUSCULAR | Status: DC | PRN
  Administered 2022-08-17: 5 mg via INTRAVENOUS
  Administered 2022-08-17: 10 mg via INTRAVENOUS

## 2022-08-17 MED ORDER — ONDANSETRON HCL 4 MG/2ML IJ SOLN
INTRAMUSCULAR | Status: DC | PRN
  Administered 2022-08-17: 4 mg via INTRAVENOUS

## 2022-08-17 MED ORDER — DEXAMETHASONE SODIUM PHOSPHATE 10 MG/ML IJ SOLN
INTRAMUSCULAR | Status: AC
  Filled 2022-08-17: qty 1

## 2022-08-17 MED ORDER — DEXMEDETOMIDINE HCL IN NACL 80 MCG/20ML IV SOLN
INTRAVENOUS | Status: AC
  Filled 2022-08-17: qty 20

## 2022-08-17 MED ORDER — SODIUM CHLORIDE 0.9 % IR SOLN
Status: DC | PRN
  Administered 2022-08-17: 3000 mL

## 2022-08-17 MED ORDER — OXYCODONE HCL 5 MG/5ML PO SOLN: 5.0000 mg | Freq: Once | ORAL | Status: DC | PRN

## 2022-08-17 MED ORDER — FENTANYL CITRATE (PF) 100 MCG/2ML IJ SOLN
INTRAMUSCULAR | Status: AC
  Filled 2022-08-17: qty 2

## 2022-08-17 MED ORDER — CEPHALEXIN 500 MG PO CAPS: 500.0000 mg | ORAL_CAPSULE | Freq: Two times a day (BID) | ORAL | 0 refills | Status: AC

## 2022-08-17 MED ORDER — KETOROLAC TROMETHAMINE 30 MG/ML IJ SOLN: 30.0000 mg | Freq: Once | INTRAMUSCULAR | Status: DC | PRN

## 2022-08-17 MED ORDER — CHLORHEXIDINE GLUCONATE 0.12 % MT SOLN
15.0000 mL | Freq: Once | OROMUCOSAL | Status: AC
  Administered 2022-08-17: 15 mL via OROMUCOSAL

## 2022-08-17 MED ORDER — ORAL CARE MOUTH RINSE: 15.0000 mL | Freq: Once | OROMUCOSAL | Status: AC

## 2022-08-17 MED ORDER — FENTANYL CITRATE PF 50 MCG/ML IJ SOSY: 25.0000 ug | PREFILLED_SYRINGE | INTRAMUSCULAR | Status: DC | PRN

## 2022-08-17 MED ORDER — OXYCODONE-ACETAMINOPHEN 5-325 MG PO TABS: 1.0000 | ORAL_TABLET | ORAL | 0 refills | Status: DC | PRN

## 2022-08-17 MED ORDER — DOCUSATE SODIUM 100 MG PO CAPS: 100.0000 mg | ORAL_CAPSULE | Freq: Every day | ORAL | 0 refills | Status: DC | PRN

## 2022-08-17 MED ORDER — PROPOFOL 10 MG/ML IV BOLUS
INTRAVENOUS | Status: DC | PRN
  Administered 2022-08-17: 200 mg via INTRAVENOUS

## 2022-08-17 MED ORDER — DEXAMETHASONE SODIUM PHOSPHATE 10 MG/ML IJ SOLN
INTRAMUSCULAR | Status: DC | PRN
  Administered 2022-08-17: 8 mg via INTRAVENOUS

## 2022-08-17 MED ORDER — MIDAZOLAM HCL 2 MG/2ML IJ SOLN
INTRAMUSCULAR | Status: AC
  Filled 2022-08-17: qty 2

## 2022-08-17 MED ORDER — MIDAZOLAM HCL 5 MG/5ML IJ SOLN
INTRAMUSCULAR | Status: DC | PRN
  Administered 2022-08-17: 2 mg via INTRAVENOUS

## 2022-08-17 MED ORDER — OXYCODONE HCL 5 MG PO TABS: 5.0000 mg | ORAL_TABLET | Freq: Once | ORAL | Status: DC | PRN

## 2022-08-17 MED ORDER — FENTANYL CITRATE (PF) 100 MCG/2ML IJ SOLN
INTRAMUSCULAR | Status: DC | PRN
  Administered 2022-08-17 (×2): 50 ug via INTRAVENOUS
  Administered 2022-08-17: 100 ug via INTRAVENOUS

## 2022-08-17 MED ORDER — 0.9 % SODIUM CHLORIDE (POUR BTL) OPTIME
TOPICAL | Status: DC | PRN
  Administered 2022-08-17: 1000 mL

## 2022-08-17 SURGICAL SUPPLY — 22 items
BAG URO CATCHER STRL LF (MISCELLANEOUS) ×1 IMPLANT
BASKET ZERO TIP NITINOL 2.4FR (BASKET) IMPLANT
CATH URET FLEX-TIP 2 LUMEN 10F (CATHETERS) IMPLANT
CATH URETL OPEN 5X70 (CATHETERS) ×1 IMPLANT
CLOTH BEACON ORANGE TIMEOUT ST (SAFETY) ×1 IMPLANT
FIBER LASER MOSES 200 DFL (Laser) IMPLANT
GLOVE BIOGEL M 7.0 STRL (GLOVE) ×1 IMPLANT
GOWN STRL REUS W/ TWL XL LVL3 (GOWN DISPOSABLE) ×1 IMPLANT
GOWN STRL REUS W/TWL XL LVL3 (GOWN DISPOSABLE) ×1
GUIDEWIRE STR DUAL SENSOR (WIRE) ×2 IMPLANT
GUIDEWIRE ZIPWRE .038 STRAIGHT (WIRE) IMPLANT
KIT TURNOVER KIT A (KITS) IMPLANT
LASER FIB FLEXIVA PULSE ID 365 (Laser) IMPLANT
MANIFOLD NEPTUNE II (INSTRUMENTS) ×1 IMPLANT
PACK CYSTO (CUSTOM PROCEDURE TRAY) ×1 IMPLANT
SHEATH NAV HD 11/13X46 (SHEATH) IMPLANT
SHEATH URETERAL 12FRX35CM (MISCELLANEOUS) IMPLANT
STENT URET 6FRX26 CONTOUR (STENTS) IMPLANT
TRACTIP FLEXIVA PULS ID 200XHI (Laser) IMPLANT
TRACTIP FLEXIVA PULSE ID 200 (Laser) ×1
TUBING CONNECTING 10 (TUBING) ×1 IMPLANT
TUBING UROLOGY SET (TUBING) ×1 IMPLANT

## 2022-08-17 NOTE — Anesthesia Postprocedure Evaluation (Signed)
Anesthesia Post Note  Patient: Rose Williams  Procedure(s) Performed: CYSTOSCOPY/RETROGRADE/URETEROSCOPY/HOLMIUM LASER/STENT PLACEMENT (Bilateral: Ureter)     Patient location during evaluation: PACU Anesthesia Type: General Level of consciousness: awake Pain management: pain level controlled Vital Signs Assessment: post-procedure vital signs reviewed and stable Respiratory status: spontaneous breathing, nonlabored ventilation, respiratory function stable and patient connected to nasal cannula oxygen Cardiovascular status: blood pressure returned to baseline and stable Postop Assessment: no apparent nausea or vomiting Anesthetic complications: no   No notable events documented.  Last Vitals:  Vitals:   08/17/22 1015 08/17/22 1030  BP: (!) 117/100 (!) 160/89  Pulse: 79 99  Resp: 16 17  Temp:    SpO2: 97% 99%    Last Pain:  Vitals:   08/17/22 1030  TempSrc:   PainSc: 0-No pain                 Clairessa Boulet P Fritz Cauthon

## 2022-08-17 NOTE — Transfer of Care (Signed)
Immediate Anesthesia Transfer of Care Note  Patient: Rose Williams  Procedure(s) Performed: CYSTOSCOPY/RETROGRADE/URETEROSCOPY/HOLMIUM LASER/STENT PLACEMENT (Bilateral: Ureter)  Patient Location: PACU  Anesthesia Type:General  Level of Consciousness: awake, alert , oriented and patient cooperative  Airway & Oxygen Therapy: Patient Spontanous Breathing  Post-op Assessment: Report given to RN, Post -op Vital signs reviewed and stable and Patient moving all extremities X 4  Post vital signs: Reviewed and stable  Last Vitals:  Vitals Value Taken Time  BP 148/91 08/17/22 1007  Temp    Pulse 90 08/17/22 1009  Resp 10 08/17/22 1009  SpO2 96 % 08/17/22 1009  Vitals shown include unvalidated device data.  Last Pain:  Vitals:   08/17/22 0710  TempSrc:   PainSc: 0-No pain         Complications: No notable events documented.

## 2022-08-17 NOTE — Anesthesia Preprocedure Evaluation (Addendum)
Anesthesia Evaluation  Patient identified by MRN, date of birth, ID band Patient awake    Reviewed: Allergy & Precautions, NPO status , Patient's Chart, lab work & pertinent test results  Airway Mallampati: II  TM Distance: >3 FB Neck ROM: Full    Dental  (+) Missing   Pulmonary neg pulmonary ROS,    Pulmonary exam normal        Cardiovascular negative cardio ROS Normal cardiovascular exam     Neuro/Psych Anxiety negative neurological ROS     GI/Hepatic negative GI ROS, (+) Hepatitis -, Autoimmune  Endo/Other  negative endocrine ROS  Renal/GU Renal disease     Musculoskeletal negative musculoskeletal ROS (+)   Abdominal   Peds  Hematology negative hematology ROS (+)   Anesthesia Other Findings BILATERAL URETERAL AND RENAL STONES  Reproductive/Obstetrics                            Anesthesia Physical Anesthesia Plan  ASA: 2  Anesthesia Plan: General   Post-op Pain Management:    Induction: Intravenous  PONV Risk Score and Plan: 3 and Ondansetron, Dexamethasone, Midazolam and Treatment may vary due to age or medical condition  Airway Management Planned: LMA  Additional Equipment:   Intra-op Plan:   Post-operative Plan: Extubation in OR  Informed Consent: I have reviewed the patients History and Physical, chart, labs and discussed the procedure including the risks, benefits and alternatives for the proposed anesthesia with the patient or authorized representative who has indicated his/her understanding and acceptance.     Dental advisory given  Plan Discussed with: CRNA  Anesthesia Plan Comments:         Anesthesia Quick Evaluation

## 2022-08-17 NOTE — H&P (Signed)
Office Visit Report     08/09/2022    CC/HPI: Rose Williams is a 61 year old female seen in consultation for an obstructing right ureteral stone.   1. Right ureteral and bilateral renal stone:  -She presented the ED on 07/27/2022 with acute onset of right-sided flank pain, nausea, emesis, fevers. CT A/P 07/26/2022 demonstrated 2 adjacent proximal right ureteral stones measuring up to 6 mm at the L4 level with severe right hydroureteronephrosis. She also had additional bilateral nonobstructing stones measuring up to 5 mm. She had a 4 mm stone in her right lower pole and a 3 mm stone in her right upper pole. She also had an approximately 5 mm left lower pole stone. Of note, her left kidney was atrophic. She underwent right ureteral stent placement on 07/27/2022. Of note, the stone was impacted and required ureteroscopy to navigate a wire beyond stone. There was a small amount of medial extravasation. Urine culture 07/26/2022 resulted greater than 100,000 E. coli pansensitive. She was discharged home with a 12-day course of Omnicef.  -She denies fevers, chills, nausea, emesis, dysuria.  -She is tentatively scheduled for bilateral ureteroscopy with laser lithotripsy and basket extraction stones on 08/17/2022.  -She does have a history of urolithiasis and underwent bilateral ureteroscopy in 2018 by Dr. Louis Meckel. He has a history of RTA type I (distal) and 24-hour urine in 2018 with volume 3 L, pH 6.6 (high), citrate 147, Mag 32.   Patient currently denies fever, chills, sweats, nausea, vomiting, abdominal or flank pain, gross hematuria or dysuria.     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 - 2019 Renal and ureteral calculus - 2018, - 2018 Microscopic hematuria - 2018 Hydronephrosis Unspec, Hydronephrosis On The Left - 2014 Renal cyst, Renal cyst, acquired - 2014 Ureteral calculus, Proximal Ureteral Stone On The Left - 2014      PMH Notes:  2007-11-25 23:01:05 - Note: Normal Routine History And Physical Adult   NON-GU PMH: Personal history of other specified conditions, History of nausea and vomiting - 2014, History of fever, - 2014    FAMILY HISTORY: 1 son - Runs in Family 4 daughters - Runs in Family Death In The Family Mother - Mother Family Health Status Number - [place] In Family liver cancer - Mother nephrolithiasis - Brother, Sister   SOCIAL HISTORY: Marital Status: Married Preferred Language: English; Ethnicity: Not Hispanic Or Latino; Race: White Current Smoking Status: Patient has never smoked.   Tobacco Use Assessment Completed: Used Tobacco in last 30 days? Has never drank.  Drinks 3 caffeinated drinks per day.     Notes: Alcohol Use, Occupation:, Tobacco Use, Caffeine Use, Marital History - Currently Married   REVIEW OF SYSTEMS:    GU Review Female:   Patient denies frequent urination, hard to postpone urination, burning /pain with urination, get up at night to urinate, leakage of urine, stream starts and stops, trouble starting your stream, have to strain to urinate, and being pregnant.  Gastrointestinal (Upper):  Patient denies nausea, vomiting, and indigestion/ heartburn.  Gastrointestinal (Lower):   Patient denies diarrhea and constipation.  Constitutional:   Patient denies fever, night sweats, weight loss, and fatigue.  Skin:   Patient denies skin rash/ lesion and itching.  Eyes:   Patient denies blurred vision and double vision.  Ears/ Nose/ Throat:   Patient denies sore throat and sinus problems.   Hematologic/Lymphatic:   Patient denies swollen glands and easy bruising.  Cardiovascular:   Patient denies leg swelling and chest pains.  Respiratory:   Patient denies cough and shortness of breath.  Endocrine:   Patient denies excessive thirst.  Musculoskeletal:   Patient denies back pain and joint pain.  Neurological:   Patient denies headaches and dizziness.  Psychologic:   Patient denies depression and anxiety.   VITAL SIGNS: None   MULTI-SYSTEM PHYSICAL EXAMINATION:    Constitutional: Well-nourished. No physical deformities. Normally developed. Good grooming.  Respiratory: No labored breathing, no use of accessory muscles.   Cardiovascular: Normal temperature, normal extremity pulses, no swelling, no varicosities.  Gastrointestinal: No mass, no tenderness, no rigidity, non obese abdomen.     Complexity of Data:  Source Of History:  Patient, Medical Record Summary  Records Review:   Previous Doctor Records, Previous Hospital Records  Urine Test Review:   Urinalysis  X-Ray Review: C.T. Abdomen/Pelvis: Reviewed Films. Reviewed Report. Discussed With Patient.     PROCEDURES:          Urinalysis w/Scope Dipstick Dipstick Cont'd Micro  Color: Straw Bilirubin: Neg mg/dL WBC/hpf: 6 - 78/GNF  Appearance: Clear Ketones: Neg mg/dL RBC/hpf: 3 - 62/ZHY  Specific Gravity: <=1.005 Blood: 3+ ery/uL Bacteria: Rare (0-9/hpf)  pH: 7.0 Protein: Neg mg/dL Cystals: NS (Not Seen)  Glucose: Neg mg/dL Urobilinogen: 0.2 mg/dL Casts: NS (Not Seen)    Nitrites: Neg Trichomonas: Not Present    Leukocyte Esterase: 1+ leu/uL Mucous: Not Present      Epithelial Cells: 0 - 5/hpf      Yeast: NS (Not Seen)      Sperm: Not Present    ASSESSMENT:      ICD-10 Details  1 GU:   Renal and ureteral calculus - N20.2    PLAN:           Orders Labs CULTURE, URINE          Document Letter(s):  Created for Patient: Clinical Summary         Notes:    #1.Right ureteral and bilateral renal stone:  -S/p  right ureteral stent placement on 07/19/2022.  She is tentatively scheduled for bilateral ureteroscopy with laser lithotripsy and basket extraction of stones on 08/17/2022.  -Discussed risks and benef we discussed the options for management of kidney stones, including observation, ESWL, ureteroscopy with laser lithotripsy, and PCNL. The risks and benefits of each option were discussed.  For observation I described the risks which include but are not limited to silent renal damage, life-threatening infection, need for emergent surgery, failure to pass stone, and pain.   ESWL: risks and benefits of ESWL were outlined including infection, bleeding, pain, steinstrasse, kidney injury, need for ancillary treatments, and global anesthesia risks including but not limited to CVA, MI, DVT, PE, pneumonia, and death.   Ureteroscopy: risks and benefits of ureteroscopy were outlined, including infection, bleeding, pain, temporary ureteral stent and associated stent bother, ureteral injury, ureteral stricture, need for ancillary treatments, and global anesthesia risks including but not limited to CVA, MI, DVT, PE, pneumonia, and death.  PCNL: risks and benefits of PCNL were outlined including infection, bleeding, blood transfusion, pain, pneumothorax, bowel injury, persistent urine leak, positioning injury, inability to clear stone burden, renal laceration, arterial venous fistula or malformation, need for ancillary treatments, and global anesthesia risks including but not limited to CVA, MI, DVT, PE, pneumonia, and death.   We discussed dietary methods for stone prevention including the following: increased water intake to 2-3 liters per day, add lemon or lemon concentrate to water to increase citrate which is beneficial for stone prevention, limiting dietary sodium to less than 2000 mg per day, limiting animal protein to less than 2 servings (16 ounces/day), and limiting foods high in oxalate content (spinach, beans,  chocolate, etc.).    Urology Preoperative H&P   Chief Complaint: Bilateral ureteral and renal stones  History of Present Illness: Rose Williams is a 61 y.o. female with bilateral ureteral and renal stones.  CT A/P 07/26/2022 demonstrated 2 adjacent proximal right ureteral stones measuring up to 6 mm at the L4 level with severe right hydroureteronephrosis. She also had additional bilateral nonobstructing stones measuring up to 5 mm. She had a 4 mm stone in her right lower pole and a 3 mm stone in her right upper pole. She also had an approximately 5 mm left lower pole stone. Of note, her left kidney was atrophic. She underwent right ureteral stent placement on 07/27/2022. Of note, the stone was impacted and required ureteroscopy to navigate a wire beyond stone. There was a small amount of medial extravasation. Urine culture 07/26/2022 resulted greater than 100,000 E. coli pansensitive. She was discharged home with a 12-day course of Omnicef. Repeat Ucx 08/09/2022 resulted no growth. She denies fevers, chills, dysuria.   Past Medical History:  Diagnosis Date   Anxiety    Autoimmune hepatitis (HCC)    Cirrhosis (HCC)    Hydronephrosis    Jaundice    Kidney stones    Pyelonephritis     Past Surgical History:  Procedure Laterality Date   CYSTOSCOPY W/ URETERAL STENT PLACEMENT Right 07/26/2022   Procedure: CYSTOSCOPY WITH RETROGRADE PYELOGRAM/URETERAL STENT PLACEMENT, AND URETEROSCOPY;  Surgeon: Jannifer Hick, MD;  Location: [facility];  Service: Urology;  Laterality: Right;   LITHOTRIPSY     2008    Allergies:  Allergies  Allergen Reactions   Dairy Aid [Tilactase] Diarrhea and Nausea And Vomiting    Allergic to all dairy products   Gluten Meal Diarrhea and Nausea And Vomiting   Other Itching    Itching from melons, cantaloupe, eggplant   Pseudoephedrine Other (See Comments)    tachycardia   Whey Protein [Protein] Diarrhea and Nausea And Vomiting   Xanthan Gum Other (See Comments)     Nausea/vomiting     Family History  Problem Relation Age of Onset   Cancer Mother        liver cancer, died of liver cancer at age of 10   Cystic fibrosis Child        2 daughter and 1 son   Kidney Stones Other        runging in her family, including 2 brothers and 2 sisters    Social History:  reports that she has never smoked. She has never used smokeless tobacco. She reports that she does not drink alcohol and does not use drugs.  ROS: A complete review of systems was performed.  All systems are negative except for pertinent findings as noted.  Physical Exam:  Vital signs in last 24 hours:  Temp:  [97.8 F (36.6 C)] 97.8 F (36.6 C) (08/18 0634) Pulse Rate:  [102] 102 (08/18 0634) Resp:  [18] 18 (08/18 0634) BP: (163-191)/(98-105) 163/98 (08/18 0655) Weight:  [51.7 kg] 51.7 kg (08/18 0710) Constitutional:  Alert and oriented, No acute distress Cardiovascular: Regular rate and rhythm Respiratory: Normal respiratory effort, Lungs clear bilaterally GI: Abdomen is soft, nontender, nondistended, no abdominal masses GU: No CVA tenderness Lymphatic: No lymphadenopathy Neurologic: Grossly intact, no focal deficits Psychiatric: Normal mood and affect  Laboratory Data:  No results for input(s): "WBC", "HGB", "HCT", "PLT" in the last 72 hours.  No results for input(s): "NA", "K", "CL", "GLUCOSE", "BUN", "CALCIUM", "CREATININE" in the last 72 hours.  Invalid input(s): "CO3"   No results found for this or any previous visit (from the past 24 hour(s)). No results found for this or any previous visit (from the past 240 hour(s)).  Renal Function: No results for input(s): "CREATININE" in the last 168 hours. Estimated Creatinine Clearance: 60.3 mL/min (by C-G formula based on SCr of 0.82 mg/dL).  Radiologic Imaging: No results found.  I independently reviewed the above imaging studies.  Assessment and Plan Sherion KEREN ALVERIO is a 61 y.o. female with bilateral ureteral and  renal stones here for cystoscopy, b/l URS/LL, b/l RPG, b/l stents.  -The risks, benefits and alternatives of above was discussed with the patient.  Risks include, but are not limited to: bleeding, urinary tract infection, ureteral injury, ureteral stricture disease, chronic pain, urinary symptoms, bladder injury, stent migration, the need for nephrostomy tube placement, MI, CVA, DVT, PE and the inherent risks with general anesthesia.  The patient voices understanding and wishes to proceed.    Matt R. Hayden Mabin MD 08/17/2022, 7:27 AM  [facility] Pager: [phone]): [phone]

## 2022-08-17 NOTE — Anesthesia Procedure Notes (Signed)
Procedure Name: LMA Insertion Date/Time: 08/17/2022 8:26 AM  Performed by: Shanon Payor, CRNAPre-anesthesia Checklist: Patient identified, Emergency Drugs available, Suction available, Patient being monitored and Timeout performed Patient Re-evaluated:Patient Re-evaluated prior to induction Oxygen Delivery Method: Circle system utilized Preoxygenation: Pre-oxygenation with 100% oxygen Induction Type: IV induction LMA: LMA with gastric port inserted LMA Size: 4.0 Number of attempts: 1 Placement Confirmation: positive ETCO2, breath sounds checked- equal and bilateral and CO2 detector Tube secured with: Tape Dental Injury: Teeth and Oropharynx as per pre-operative assessment

## 2022-08-17 NOTE — Discharge Instructions (Addendum)
[facility] [phone] Post Ureteroscopy With or Without Stent Instructions  Definitions:  Ureter: The duct that transports urine from the kidney to the bladder. Stent:   A plastic hollow tube that is placed into the ureter, from the kidney to the bladder to prevent the ureter from swelling shut.  GENERAL INSTRUCTIONS:  Despite the fact that no skin incisions were used, the area around the ureter and bladder is raw and irritated. The stent is a foreign body which will further irritate the bladder wall. This irritation is manifested by increased frequency of urination, both day and night, and by an increase in the urge to urinate. In some, the urge to urinate is present almost always. Sometimes the urge is strong enough that you may not be able to stop yourself from urinating. The only real cure is to remove the stent and then give time for the bladder wall to heal which can't be done until the danger of the ureter swelling shut has passed, which varies.  You may see some blood in your urine while the stent is in place and a few days afterwards. Do not be alarmed, even if the urine was clear for a while. Get off your feet and drink lots of fluids until clearing occurs. If you start to pass clots or don't improve, call us.  DIET: You may return to your normal diet immediately. Because of the raw surface of your bladder, alcohol, spicy foods, acid type foods and drinks with caffeine may cause irritation or frequency and should be used in moderation. To keep your urine flowing freely and to avoid constipation, drink plenty of fluids during the day ( 8-10 glasses ). Tip: Avoid cranberry juice because it is very acidic.  ACTIVITY: Your physical activity doesn't need to be restricted. However, if you are very active, you may see some blood in your urine. We suggest that you reduce your activity under these circumstances until the bleeding has stopped.  BOWELS: It is important to  keep your bowels regular during the postoperative period. Straining with bowel movements can cause bleeding. A bowel movement every other day is reasonable. Use a mild laxative if needed, such as Milk of Magnesia 2-3 tablespoons, or 2 Dulcolax tablets. Call if you continue to have problems. If you have been taking narcotics for pain, before, during or after your surgery, you may be constipated. Take a laxative if necessary.   MEDICATION: You should resume your pre-surgery medications unless told not to. In addition you will often be given an antibiotic to prevent infection. These should be taken as prescribed until the bottles are finished unless you are having an unusual reaction to one of the drugs.  PROBLEMS YOU SHOULD REPORT TO [place]: Fevers over 100.5 Fahrenheit. Heavy bleeding, or clots ( See above notes about blood in urine ). Inability to urinate. Drug reactions ( hives, rash, nausea, vomiting, diarrhea ). Severe burning or pain with urination that is not improving.  FOLLOW-UP: You will need a follow-up appointment to monitor your progress. Call for this appointment at the number listed above. Usually the first appointment will be about three to fourteen days after your surgery.  You have bilateral ureteral stents in place.  These will remain in place until you follow-up in the office.  [place] will call to arrange an appointment for bilateral ureteral stent removal in the office in about 2 weeks.

## 2022-08-17 NOTE — Op Note (Signed)
Operative Note  Preoperative diagnosis:  1.  Right ureteral and bilateral renal stones  Postoperative diagnosis: 1.  Right ureteral and bilateral renal stones  Procedure(s): 1.  Cystoscopy 2. Bilateral ureteroscopy with laser lithotripsy and basket extraction of stones 3. Bilateral retrograde pyelogram 4. Bilateral ureteral stent placement (with right-sided exchange) 5. Fluoroscopy with intraoperative interpretation  Surgeon: Jettie Pagan, MD  Assistants:  None  Anesthesia:  General  Complications:  None  EBL:  Minimal  Specimens: 1. Stones for stone analysis (to be done at [facility])  Drains/Catheters: 1.  Right 6Fr x 26cm ureteral stent without a tether string 2. Left 6Fr x 26cm ureteral stent without a tether string  Intraoperative findings:   Cystoscopy demonstrated no suspicious bladder lesions Right ureteroscopy demonstrated 3 separate impacted proximal right ureteral stones measuring 6 mm each.  Of note, the previously placed stent was noted to pass submucosal induration to the stone for approximately 2 cm and anterior at the level of the renal pelvis.  There was no significant extravasation of contrast.  Right ureteral stones were fragmented and basket distracted.  Right renal stones were fragmented and basket extracted with no significant fragment residual. Left renal stones were dusted and a portion of these were removed.  No significant large fragments remained. Bilateral retrograde pyelogram demonstrating moderate bilateral hydronephrosis. Successful bilateral stent placement.  Indication:  Rose Williams is a 61 y.o. female with history of impacted proximal right ureteral stone with sepsis.  She underwent right ureteral stent placement and presents today for definitive treatment of her bilateral renal and ureteral stones.  Description of procedure: After informed consent was obtained from the patient, the patient was identified and taken to the  operating room and placed in the supine position.  General anesthesia was administered as well as perioperative IV antibiotics.  At the beginning of the case, a time-out was performed to properly identify the patient, the surgery to be performed, and the surgical site.  Sequential compression devices were applied to the lower extremities at the beginning of the case for DVT prophylaxis.  The patient was then placed in the dorsal lithotomy supine position, prepped and draped in sterile fashion.  Preliminary scout fluoroscopy revealed that there was a 6 proximal right ureter mm calcification area at the, which corresponds to the  stone found on the preoperative CT scan. We then passed the 21-French rigid cystoscope through the urethra and into the bladder under vision without any difficulty, noting a normal urethra without strictures.  A systematic evaluation of the bladder revealed no evidence of any suspicious bladder lesions.  Ureteral orifices were in normal position.    The distal aspect of the ureteral stent was seen protruding from the right ureteral orifice.  We then used the alligator-tooth forceps and grasped the distal end of the ureteral stent and brought it out the urethral meatus while watching the proximal coil straighten out nicely on fluoroscopy. Through the ureteral stent, we then passed a 0.038 sensor wire up to the level of the renal pelvis.  The ureteral stent was then removed, leaving the sensor wire up the right ureter.    I then passed a dual-lumen catheter and performed a retrograde pyelogram demonstrating no extravasation of contrast.  There is moderate right hydronephrosis.  I then passed a separate 0.038 sensor wire into the right kidney.  In similar fashion, I passed a 0.038 sensor wire up the left ureteral orifice and into the left kidney.  I then passed a dual-lumen  cath and performed a left retrograde pyelogram demonstrating moderate left hydronephrosis.  I then passed a  separate 0.038 sensor wire into the kidney.  A semi-rigid ureteroscope was passed alongside the wire up the distal ureter which appeared normal.  I navigated this into the proximal right ureter where I encountered that the previously placed sensor wire was submucosal at the area of the impacted stone.  I then remove this wire.  The other wire remained in the true lumen.  A repeat retrograde demonstrated no extravasation of contrast outside of the ureter.  Using the 200 m holmium laser fiber, the ureteral stones were fragmented completely.  An escape basket was then used to remove the fragments under visual guidance.  These were sent for chemical analysis.  I then passed a separate 0.038 sensor wire into the right kidney.  Over this wire, I passed a dual-lumen flexible ureteroscope.  I then surveyed the kidney.  I found 2 separate small approximately 5 mm stones in the right lower pole.  These were fragmented.  I then placed a ureteral access sheath that was 12/14 [place] and 35 cm in length.  This was passed under fluoroscopic guidance.  I then passed extracted these right renal stones.  No significant large stone fragments remained. With the ureteroscope in the kidney, a gentle pyelogram was performed to delineate the calyceal system and we evaluated the calyces systematically. We encountered no further large stone fragments. The rest of the stone fragments were very tiny and these were  irrigated away gently. The calyces were re-inspected and there were no significant stone fragment residual.   We then withdrew the ureteroscope back down the ureter along with the access sheath, noting no evidence of any stones along the course of the ureter.  Prior to removing the ureteroscope, we did pass the Glidewire back up to the ureter to the renal pelvis.  Once the ureteroscope was removed, we then used the Glidewire under fluoroscopic guidance and passed up a 6-French x 26 cm double-pigtail ureteral stent up the  ureter, making sure that the proximal and distal ends coiled within the kidney and bladder respectively.  I turned attention to the left side with a semirigid ureteroscope noting a normal distal ureter.  There were no stone fragments seen in the left ureter.  I then passed a single-lumen flexible digital ureteroscope over the previously placed guidewire.  I used a safety wire.  I encountered 2 small left lower pole stones.  These were dusted.  I did remove one larger fragment from the side.  The remaining fragments were very small.  With the ureteroscope in the kidney, upper repeat a left retrograde pyelogram the calyces.  I then surveyed each calyx and encountered no further large stone fragments.  I gently removed the ureteroscope along the course of the ureter noting no trauma to the ureter.  Once the ureteroscope was removed, I then passed a 6 [place] by 26 cm double-pigtail ureteral stent up the ureter to make sure the proximal distal curls within the kidney and bladder specially.  Once the stents were placed, then advanced a cystoscope in the bladder and drained the bladder.  The distal curls were seen currently within the bladder.  The patient tolerated the procedure well and there was no complication. Patient was awoken from anesthesia and taken to the recovery room in stable condition. I was present and scrubbed for the entirety of the case.  Plan:  Patient will be discharged home.  Follow up  with me in 14 days for stent removal in the office.  These findings were discussed with her daughter.   Matt R. Doria Fern MD [facility]  Pager: [phone]

## 2022-08-18 ENCOUNTER — Encounter (HOSPITAL_COMMUNITY): Payer: Self-pay | Admitting: Urology

## 2022-09-04 DIAGNOSIS — N202 Calculus of kidney with calculus of ureter: Secondary | ICD-10-CM | POA: Diagnosis not present

## 2022-10-10 DIAGNOSIS — N200 Calculus of kidney: Secondary | ICD-10-CM | POA: Diagnosis not present

## 2022-10-23 DIAGNOSIS — N200 Calculus of kidney: Secondary | ICD-10-CM | POA: Diagnosis not present

## 2022-11-15 ENCOUNTER — Other Ambulatory Visit (HOSPITAL_COMMUNITY): Payer: Self-pay

## 2022-11-16 ENCOUNTER — Other Ambulatory Visit (HOSPITAL_COMMUNITY): Payer: Self-pay

## 2023-01-04 ENCOUNTER — Other Ambulatory Visit: Payer: Self-pay

## 2023-01-04 DIAGNOSIS — K754 Autoimmune hepatitis: Secondary | ICD-10-CM

## 2023-03-07 ENCOUNTER — Other Ambulatory Visit (INDEPENDENT_AMBULATORY_CARE_PROVIDER_SITE_OTHER): Payer: Commercial Managed Care - PPO

## 2023-03-07 DIAGNOSIS — K754 Autoimmune hepatitis: Secondary | ICD-10-CM

## 2023-03-07 LAB — CBC WITH DIFFERENTIAL/PLATELET
Basophils Absolute: 0 10*3/uL (ref 0.0–0.1)
Basophils Relative: 0.7 % (ref 0.0–3.0)
Eosinophils Absolute: 0.5 10*3/uL (ref 0.0–0.7)
Eosinophils Relative: 11 % — ABNORMAL HIGH (ref 0.0–5.0)
HCT: 43.4 % (ref 36.0–46.0)
Hemoglobin: 14.4 g/dL (ref 12.0–15.0)
Lymphocytes Relative: 19.5 % (ref 12.0–46.0)
Lymphs Abs: 0.9 10*3/uL (ref 0.7–4.0)
MCHC: 33.2 g/dL (ref 30.0–36.0)
MCV: 100.4 fl — ABNORMAL HIGH (ref 78.0–100.0)
Monocytes Absolute: 0.5 10*3/uL (ref 0.1–1.0)
Monocytes Relative: 10.2 % (ref 3.0–12.0)
Neutro Abs: 2.8 10*3/uL (ref 1.4–7.7)
Neutrophils Relative %: 58.6 % (ref 43.0–77.0)
Platelets: 190 10*3/uL (ref 150.0–400.0)
RBC: 4.32 Mil/uL (ref 3.87–5.11)
RDW: 13.4 % (ref 11.5–15.5)
WBC: 4.7 10*3/uL (ref 4.0–10.5)

## 2023-03-07 LAB — HEPATIC FUNCTION PANEL
ALT: 18 U/L (ref 0–35)
AST: 27 U/L (ref 0–37)
Albumin: 4.2 g/dL (ref 3.5–5.2)
Alkaline Phosphatase: 70 U/L (ref 39–117)
Bilirubin, Direct: 0.2 mg/dL (ref 0.0–0.3)
Total Bilirubin: 0.9 mg/dL (ref 0.2–1.2)
Total Protein: 7.5 g/dL (ref 6.0–8.3)

## 2023-03-08 ENCOUNTER — Other Ambulatory Visit: Payer: Self-pay

## 2023-03-08 DIAGNOSIS — K754 Autoimmune hepatitis: Secondary | ICD-10-CM

## 2023-03-13 ENCOUNTER — Other Ambulatory Visit (HOSPITAL_COMMUNITY): Payer: Self-pay

## 2023-03-13 ENCOUNTER — Other Ambulatory Visit: Payer: Self-pay | Admitting: Internal Medicine

## 2023-03-13 MED ORDER — AZATHIOPRINE 50 MG PO TABS
100.0000 mg | ORAL_TABLET | Freq: Every day | ORAL | 0 refills | Status: DC
  Filled 2023-03-13: qty 180, 90d supply, fill #0

## 2023-04-23 DIAGNOSIS — N281 Cyst of kidney, acquired: Secondary | ICD-10-CM | POA: Diagnosis not present

## 2023-04-23 DIAGNOSIS — N200 Calculus of kidney: Secondary | ICD-10-CM | POA: Diagnosis not present

## 2023-05-14 ENCOUNTER — Encounter: Payer: Self-pay | Admitting: Internal Medicine

## 2023-05-14 ENCOUNTER — Ambulatory Visit: Payer: Commercial Managed Care - PPO | Admitting: Internal Medicine

## 2023-05-14 VITALS — Ht 66.0 in | Wt 126.5 lb

## 2023-05-14 DIAGNOSIS — K7460 Unspecified cirrhosis of liver: Secondary | ICD-10-CM

## 2023-05-14 DIAGNOSIS — K754 Autoimmune hepatitis: Secondary | ICD-10-CM

## 2023-05-14 DIAGNOSIS — Z1211 Encounter for screening for malignant neoplasm of colon: Secondary | ICD-10-CM

## 2023-05-14 MED ORDER — AZATHIOPRINE 50 MG PO TABS
100.0000 mg | ORAL_TABLET | Freq: Every day | ORAL | 3 refills | Status: DC
  Filled 2023-08-23: qty 180, 90d supply, fill #0
  Filled 2023-12-16: qty 180, 90d supply, fill #1
  Filled 2024-04-17: qty 180, 90d supply, fill #2

## 2023-05-14 NOTE — Progress Notes (Signed)
HISTORY OF PRESENT ILLNESS:  Rose Williams is a 62 y.o. female , [employer] employee, who presents today for follow-up regarding management of autoimmune hepatitis diagnosed September 2015. Last evaluated in this office January 08, 2022. See that dictation. She continues on azathioprine 100 mg daily. She has been compliant with regular blood Checks. These have been quite favorable disease normal) over time. Her CBC and liver tests in July 2023 and again March 2024 were entirely normal. Her most recent abdominal ultrasound January 04, 2022 was normal. Her GI review of systems is entirely negative. She still contemplating colon cancer screening strategies. She is still searching for PCP.   REVIEW OF SYSTEMS:  All non-GI ROS negative as otherwise stated in the HPI except for sinus and allergy trouble  Past Medical History:  Diagnosis Date   Anxiety    Autoimmune hepatitis (HCC)    Cirrhosis (HCC)    Hydronephrosis    Jaundice    Kidney stones    Pyelonephritis     Past Surgical History:  Procedure Laterality Date   CYSTOSCOPY W/ URETERAL STENT PLACEMENT Right 07/26/2022   Procedure: CYSTOSCOPY WITH RETROGRADE PYELOGRAM/URETERAL STENT PLACEMENT, AND URETEROSCOPY;  Surgeon: Jannifer Hick, MD;  Location: [facility];  Service: Urology;  Laterality: Right;   CYSTOSCOPY/URETEROSCOPY/HOLMIUM LASER/STENT PLACEMENT Bilateral 08/17/2022   Procedure: CYSTOSCOPY/RETROGRADE/URETEROSCOPY/HOLMIUM LASER/STENT PLACEMENT;  Surgeon: Jannifer Hick, MD;  Location: [facility];  Service: Urology;  Laterality: Bilateral;  ONLY NEEDS 60 MIN   LITHOTRIPSY     2008    Social History Dametria K Retana  reports that she has never smoked. She has never used smokeless tobacco. She reports that she does not drink alcohol and does not use drugs.  family history includes Cancer in her mother; Cystic fibrosis in her child; Kidney Stones in an other family member.  Allergies  Allergen Reactions   Dairy Aid [Tilactase]  Diarrhea and Nausea And Vomiting    Allergic to all dairy products   Gluten Meal Diarrhea and Nausea And Vomiting   Other Itching    Itching from melons, cantaloupe, eggplant   Pseudoephedrine Other (See Comments)    tachycardia   Whey Protein [Protein] Diarrhea and Nausea And Vomiting   Xanthan Gum Other (See Comments)    Nausea/vomiting        PHYSICAL EXAMINATION: Vital signs: Ht 5\' 6"  (1.676 m)   Wt 126 lb 8 oz (57.4 kg)   BMI 20.42 kg/m   Constitutional: generally well-appearing, no acute distress Psychiatric: alert and oriented x3, cooperative Eyes: extraocular movements intact, anicteric, conjunctiva pink Mouth: oral pharynx moist, no lesions Neck: supple no lymphadenopathy Cardiovascular: heart regular rate and rhythm, no murmur Lungs: clear to auscultation bilaterally Abdomen: soft, nontender, nondistended, no obvious ascites, no peritoneal signs, normal bowel sounds, no organomegaly Rectal: Omitted Extremities: no clubbing, cyanosis, or lower extremity edema bilaterally Skin: no lesions on visible extremities Neuro: No focal deficits. No asterixis.    ASSESSMENT:   1.  Autoimmune hepatitis with radiographic evidence of cirrhosis, previously.  Most recent ultrasound last year was unremarkable.  She remains asymptomatic on azathioprine 100 mg daily with normal liver tests and normal CBC. 2.  History of shingles 3.  Colon cancer screening     PLAN:   1.  Continue azathioprine 100 mg daily.  Prescription refilled.  Medication risks reviewed 2.  Continue blood work every 6 months unless otherwise directed 3.  Continue surveillance ultrasound of the liver annually unless otherwise directed.  1 has  been ordered today. 4.  Routine office follow-up 1 year.  Sooner if needed 5.  Obtain PCP.  Advised 6.  We discussed in some detail Cologuard testing and optical colonoscopy.  She tells me that she is interested in the latter.  She tells me that she needs to check her  schedule and will contact our office to make a previsit appointment. A total time of 30 minutes was spent preparing to see the patient, reviewing blood work, reviewing radiology studies, obtaining interval history, performing medically appropriate physical examination, counseling the patient regarding her above listed issues, ordering laboratories and x-rays and medications.  Reviewing the nature of colonoscopy, and documenting clinical information in the health record

## 2023-05-14 NOTE — Patient Instructions (Addendum)
_______________________________________________________  If your blood pressure at your visit was 140/90 or greater, please contact your primary care physician to follow up on this.  _______________________________________________________  If you are age 61 or older, your body mass index should be between 23-30. Your Body mass index is 20.42 kg/m. If this is out of the aforementioned range listed, please consider follow up with your Primary Care Provider.  If you are age 72 or younger, your body mass index should be between 19-25. Your Body mass index is 20.42 kg/m. If this is out of the aformentioned range listed, please consider follow up with your Primary Care Provider.   ________________________________________________________  The [place] [facility] providers would like to encourage you to use [facility] to communicate with providers for non-urgent requests or questions.  Due to long hold times on the telephone, sending your provider a message by [facility] may be a faster and more efficient way to get a response.  Please allow 48 business hours for a response.  Please remember that this is for non-urgent requests.   We have sent the following medications to your pharmacy for you to pick up at your convenience:  Imuran  _______________________________________________________  [facility] - [phone]

## 2023-08-19 ENCOUNTER — Other Ambulatory Visit (HOSPITAL_COMMUNITY): Payer: Self-pay

## 2023-08-23 ENCOUNTER — Other Ambulatory Visit (HOSPITAL_COMMUNITY): Payer: Self-pay

## 2023-08-24 ENCOUNTER — Other Ambulatory Visit (HOSPITAL_COMMUNITY): Payer: Self-pay

## 2023-10-16 ENCOUNTER — Other Ambulatory Visit (INDEPENDENT_AMBULATORY_CARE_PROVIDER_SITE_OTHER): Payer: Commercial Managed Care - PPO

## 2023-10-16 DIAGNOSIS — K754 Autoimmune hepatitis: Secondary | ICD-10-CM | POA: Diagnosis not present

## 2023-10-16 LAB — CBC WITH DIFFERENTIAL/PLATELET
Basophils Absolute: 0 10*3/uL (ref 0.0–0.1)
Basophils Relative: 0.5 % (ref 0.0–3.0)
Eosinophils Absolute: 0.9 10*3/uL — ABNORMAL HIGH (ref 0.0–0.7)
Eosinophils Relative: 16.8 % — ABNORMAL HIGH (ref 0.0–5.0)
HCT: 42.7 % (ref 36.0–46.0)
Hemoglobin: 14.2 g/dL (ref 12.0–15.0)
Lymphocytes Relative: 15.3 % (ref 12.0–46.0)
Lymphs Abs: 0.8 10*3/uL (ref 0.7–4.0)
MCHC: 33.4 g/dL (ref 30.0–36.0)
MCV: 99.6 fL (ref 78.0–100.0)
Monocytes Absolute: 0.4 10*3/uL (ref 0.1–1.0)
Monocytes Relative: 7.9 % (ref 3.0–12.0)
Neutro Abs: 3.2 10*3/uL (ref 1.4–7.7)
Neutrophils Relative %: 59.5 % (ref 43.0–77.0)
Platelets: 203 10*3/uL (ref 150.0–400.0)
RBC: 4.29 Mil/uL (ref 3.87–5.11)
RDW: 13.3 % (ref 11.5–15.5)
WBC: 5.4 10*3/uL (ref 4.0–10.5)

## 2023-10-16 LAB — HEPATIC FUNCTION PANEL
ALT: 17 U/L (ref 0–35)
AST: 28 U/L (ref 0–37)
Albumin: 4.3 g/dL (ref 3.5–5.2)
Alkaline Phosphatase: 87 U/L (ref 39–117)
Bilirubin, Direct: 0.1 mg/dL (ref 0.0–0.3)
Total Bilirubin: 0.7 mg/dL (ref 0.2–1.2)
Total Protein: 7.9 g/dL (ref 6.0–8.3)

## 2023-10-18 ENCOUNTER — Other Ambulatory Visit: Payer: Self-pay

## 2023-10-18 DIAGNOSIS — K754 Autoimmune hepatitis: Secondary | ICD-10-CM

## 2023-12-31 ENCOUNTER — Other Ambulatory Visit: Payer: Self-pay | Admitting: Internal Medicine

## 2023-12-31 DIAGNOSIS — K754 Autoimmune hepatitis: Secondary | ICD-10-CM

## 2024-04-22 ENCOUNTER — Telehealth: Payer: Self-pay

## 2024-04-22 NOTE — Telephone Encounter (Signed)
-----   Message from Nurse Jeremiah Monica sent at 10/18/2023  9:19 AM EDT ----- Regarding: Labs Pt needs labs in 6 mth, order in epic.

## 2024-04-22 NOTE — Telephone Encounter (Signed)
Pt aware, order in epic. 

## 2024-05-29 ENCOUNTER — Other Ambulatory Visit

## 2024-05-29 DIAGNOSIS — K754 Autoimmune hepatitis: Secondary | ICD-10-CM

## 2024-05-29 LAB — CBC WITH DIFFERENTIAL/PLATELET
Basophils Absolute: 0 10*3/uL (ref 0.0–0.1)
Basophils Relative: 0.6 % (ref 0.0–3.0)
Eosinophils Absolute: 1.2 10*3/uL — ABNORMAL HIGH (ref 0.0–0.7)
Eosinophils Relative: 24.2 % — ABNORMAL HIGH (ref 0.0–5.0)
HCT: 41.1 % (ref 36.0–46.0)
Hemoglobin: 14.1 g/dL (ref 12.0–15.0)
Lymphocytes Relative: 19 % (ref 12.0–46.0)
Lymphs Abs: 1 10*3/uL (ref 0.7–4.0)
MCHC: 34.2 g/dL (ref 30.0–36.0)
MCV: 97.8 fl (ref 78.0–100.0)
Monocytes Absolute: 0.4 10*3/uL (ref 0.1–1.0)
Monocytes Relative: 8.4 % (ref 3.0–12.0)
Neutro Abs: 2.4 10*3/uL (ref 1.4–7.7)
Neutrophils Relative %: 47.8 % (ref 43.0–77.0)
Platelets: 198 10*3/uL (ref 150.0–400.0)
RBC: 4.2 Mil/uL (ref 3.87–5.11)
RDW: 13.2 % (ref 11.5–15.5)
WBC: 5.1 10*3/uL (ref 4.0–10.5)

## 2024-05-29 LAB — HEPATIC FUNCTION PANEL
ALT: 30 U/L (ref 0–35)
AST: 39 U/L — ABNORMAL HIGH (ref 0–37)
Albumin: 4.3 g/dL (ref 3.5–5.2)
Alkaline Phosphatase: 110 U/L (ref 39–117)
Bilirubin, Direct: 0.2 mg/dL (ref 0.0–0.3)
Total Bilirubin: 0.6 mg/dL (ref 0.2–1.2)
Total Protein: 8.1 g/dL (ref 6.0–8.3)

## 2024-06-01 ENCOUNTER — Ambulatory Visit: Payer: Self-pay | Admitting: Internal Medicine

## 2024-06-26 ENCOUNTER — Encounter: Payer: Self-pay | Admitting: Internal Medicine

## 2024-06-26 ENCOUNTER — Ambulatory Visit: Admitting: Internal Medicine

## 2024-06-26 VITALS — HR 120 | Ht 65.75 in | Wt 129.2 lb

## 2024-06-26 DIAGNOSIS — K754 Autoimmune hepatitis: Secondary | ICD-10-CM | POA: Diagnosis not present

## 2024-06-26 DIAGNOSIS — Z8619 Personal history of other infectious and parasitic diseases: Secondary | ICD-10-CM | POA: Diagnosis not present

## 2024-06-26 DIAGNOSIS — Z1211 Encounter for screening for malignant neoplasm of colon: Secondary | ICD-10-CM

## 2024-06-26 DIAGNOSIS — K7460 Unspecified cirrhosis of liver: Secondary | ICD-10-CM

## 2024-06-26 MED ORDER — AZATHIOPRINE 50 MG PO TABS
100.0000 mg | ORAL_TABLET | Freq: Every day | ORAL | 3 refills | Status: AC
Start: 2024-06-26 — End: ?

## 2024-06-26 NOTE — Progress Notes (Signed)
 HISTORY OF PRESENT ILLNESS:  Rose Williams is a 63 y.o. female, former [employer] employee, who presents today for follow-up regarding management of autoimmune hepatitis diagnosed September 2015.  She was last evaluated in this office May 14, 2023.  See that dictation for details.  She continues on azathioprine  100 mg daily.  We follow her blood test regularly.  These have normalized.  Last blood work from May 29, 2024 showed normal liver tests except for an AST of 39.  Normal CBC with hemoglobin 14.1.  She tells me that she has transition to a new job in [employer] .  Otherwise, she is feeling great and without complaints.  She wishes to have her screening colonoscopy in October.  REVIEW OF SYSTEMS:  All non-GI ROS negative except for seasonal sinus and allergy  Past Medical History:  Diagnosis Date   Anxiety    Autoimmune hepatitis (HCC)    Cirrhosis (HCC)    Hydronephrosis    Jaundice    Kidney stones    Pyelonephritis     Past Surgical History:  Procedure Laterality Date   CYSTOSCOPY W/ URETERAL STENT PLACEMENT Right 07/26/2022   Procedure: CYSTOSCOPY WITH RETROGRADE PYELOGRAM/URETERAL STENT PLACEMENT, AND URETEROSCOPY;  Surgeon: Selma Donnice SAUNDERS, MD;  Location: [facility];  Service: Urology;  Laterality: Right;   CYSTOSCOPY/URETEROSCOPY/HOLMIUM LASER/STENT PLACEMENT Bilateral 08/17/2022   Procedure: CYSTOSCOPY/RETROGRADE/URETEROSCOPY/HOLMIUM LASER/STENT PLACEMENT;  Surgeon: Selma Donnice SAUNDERS, MD;  Location: [facility];  Service: Urology;  Laterality: Bilateral;  ONLY NEEDS 60 MIN   LITHOTRIPSY     2008    Social History Rose Williams  reports that she has never smoked. She has never used smokeless tobacco. She reports that she does not drink alcohol and does not use drugs.  family history includes Cancer in her mother; Cystic fibrosis in her child; Kidney Stones in an other family member.  Allergies  Allergen Reactions   Dairy Aid [Tilactase] Diarrhea and Nausea And  Vomiting    Allergic to all dairy products   Gluten Meal Diarrhea and Nausea And Vomiting   Other Itching    Itching from melons, cantaloupe, eggplant   Pseudoephedrine Other (See Comments)    tachycardia   Whey Protein [Protein] Diarrhea and Nausea And Vomiting   Xanthan Gum Other (See Comments)    Nausea/vomiting        PHYSICAL EXAMINATION: Vital signs: Pulse (!) 120   Ht 5' 5.75 (1.67 m)   Wt 129 lb 4 oz (58.6 kg)   BMI 21.02 kg/m   Constitutional: generally well-appearing, no acute distress Psychiatric: alert and oriented x3, cooperative Eyes: extraocular movements intact, anicteric, conjunctiva pink Mouth: oral pharynx moist, no lesions Neck: supple no lymphadenopathy Cardiovascular: heart regular rate and rhythm, no murmur Lungs: clear to auscultation bilaterally Abdomen: soft, nontender, nondistended, no obvious ascites, no peritoneal signs, normal bowel sounds, no organomegaly Rectal: Omitted Extremities: no clubbing, cyanosis, or lower extremity edema bilaterally Skin: no lesions on visible extremities Neuro: No focal deficits. No asterixis.    ASSESSMENT:   1.  Autoimmune hepatitis with radiographic evidence of cirrhosis, previously.  Most recent ultrasound January 2023 was unremarkable.  She remains asymptomatic on azathioprine  100 mg daily with normal liver tests and normal CBC. 2.  History of shingles 3.  Colon cancer screening     PLAN:   1.  Continue azathioprine  100 mg daily.  Prescription refilled.  Medication risks reviewed 2.  Continue blood work every 6 months unless otherwise directed 3.  Continue surveillance  ultrasound of the liver annually unless otherwise directed.  1 has been ordered today. 4.  Routine office follow-up 1 year.  Sooner if needed 5.  Obtain PCP.  Advised previously 6.  Patient reports that she will contact the office prior to a screening colonoscopy which she would like to have done in October of this year A total time of 30  minutes was spent preparing to see the patient, reviewing blood work, reviewing radiology studies, obtaining interval history, performing medically appropriate physical examination, counseling the patient regarding her above listed issues, ordering laboratories and x-rays and medications.  Reviewing the nature of colonoscopy, and documenting clinical information in the health record

## 2024-06-26 NOTE — Patient Instructions (Signed)
 We have sent the following medications to your pharmacy for you to pick up at your convenience:  Imuran .   Please follow up in one year.  _______________________________________________________  If your blood pressure at your visit was 140/90 or greater, please contact your primary care physician to follow up on this.  _______________________________________________________  If you are age 62 or older, your body mass index should be between 23-30. Your Body mass index is 21.02 kg/m. If this is out of the aforementioned range listed, please consider follow up with your Primary Care Provider.  If you are age 24 or younger, your body mass index should be between 19-25. Your Body mass index is 21.02 kg/m. If this is out of the aformentioned range listed, please consider follow up with your Primary Care Provider.   ________________________________________________________  The [place] [facility] providers would like to encourage you to use [facility] to communicate with providers for non-urgent requests or questions.  Due to long hold times on the telephone, sending your provider a message by [facility] may be a faster and more efficient way to get a response.  Please allow 48 business hours for a response.  Please remember that this is for non-urgent requests.  _______________________________________________________

## 2024-09-02 ENCOUNTER — Other Ambulatory Visit: Payer: Self-pay

## 2024-09-02 ENCOUNTER — Telehealth: Payer: Self-pay

## 2024-09-02 DIAGNOSIS — K754 Autoimmune hepatitis: Secondary | ICD-10-CM

## 2024-09-02 NOTE — Telephone Encounter (Signed)
 Pt knows to come in for labs, order in epic,

## 2024-09-02 NOTE — Telephone Encounter (Signed)
-----   Message from Nurse Rock DEL sent at 06/01/2024 12:52 PM EDT ----- Regarding: LFT's Pt needs LFT-s enter order

## 2024-09-24 ENCOUNTER — Other Ambulatory Visit (INDEPENDENT_AMBULATORY_CARE_PROVIDER_SITE_OTHER)

## 2024-09-24 ENCOUNTER — Ambulatory Visit: Payer: Self-pay | Admitting: Internal Medicine

## 2024-09-24 DIAGNOSIS — K754 Autoimmune hepatitis: Secondary | ICD-10-CM

## 2024-09-24 LAB — HEPATIC FUNCTION PANEL
ALT: 33 U/L (ref 0–35)
AST: 51 U/L — ABNORMAL HIGH (ref 0–37)
Albumin: 4.5 g/dL (ref 3.5–5.2)
Alkaline Phosphatase: 100 U/L (ref 39–117)
Bilirubin, Direct: 0.2 mg/dL (ref 0.0–0.3)
Total Bilirubin: 0.7 mg/dL (ref 0.2–1.2)
Total Protein: 8.5 g/dL — ABNORMAL HIGH (ref 6.0–8.3)

## 2024-09-25 ENCOUNTER — Other Ambulatory Visit: Payer: Self-pay

## 2024-09-25 DIAGNOSIS — K754 Autoimmune hepatitis: Secondary | ICD-10-CM

## 2024-11-25 ENCOUNTER — Telehealth: Payer: Self-pay

## 2024-11-25 NOTE — Telephone Encounter (Signed)
 Left detailed message for pt to come in for repeat labs next week. Orders in epic.

## 2024-11-25 NOTE — Telephone Encounter (Signed)
-----   Message from Nurse Rock DEL sent at 09/25/2024  9:21 AM EDT ----- Regarding: labs Pt needs labs, orders in epic.

## 2024-12-25 ENCOUNTER — Ambulatory Visit: Payer: Self-pay | Admitting: Internal Medicine

## 2024-12-25 ENCOUNTER — Other Ambulatory Visit

## 2024-12-25 DIAGNOSIS — K754 Autoimmune hepatitis: Secondary | ICD-10-CM | POA: Diagnosis not present

## 2024-12-25 LAB — CBC WITH DIFFERENTIAL/PLATELET
Basophils Absolute: 0 K/uL (ref 0.0–0.1)
Basophils Relative: 0.7 % (ref 0.0–3.0)
Eosinophils Absolute: 1.2 K/uL — ABNORMAL HIGH (ref 0.0–0.7)
Eosinophils Relative: 20.1 % — ABNORMAL HIGH (ref 0.0–5.0)
HCT: 35.8 % — ABNORMAL LOW (ref 36.0–46.0)
Hemoglobin: 12.2 g/dL (ref 12.0–15.0)
Lymphocytes Relative: 14.4 % (ref 12.0–46.0)
Lymphs Abs: 0.9 K/uL (ref 0.7–4.0)
MCHC: 34 g/dL (ref 30.0–36.0)
MCV: 101.8 fl — ABNORMAL HIGH (ref 78.0–100.0)
Monocytes Absolute: 0.4 K/uL (ref 0.1–1.0)
Monocytes Relative: 6.6 % (ref 3.0–12.0)
Neutro Abs: 3.5 K/uL (ref 1.4–7.7)
Neutrophils Relative %: 58.2 % (ref 43.0–77.0)
Platelets: 190 K/uL (ref 150.0–400.0)
RBC: 3.52 Mil/uL — ABNORMAL LOW (ref 3.87–5.11)
RDW: 15.2 % (ref 11.5–15.5)
WBC: 6 K/uL (ref 4.0–10.5)

## 2024-12-25 LAB — HEPATIC FUNCTION PANEL
ALT: 24 U/L (ref 3–35)
AST: 52 U/L — ABNORMAL HIGH (ref 5–37)
Albumin: 4 g/dL (ref 3.5–5.2)
Alkaline Phosphatase: 91 U/L (ref 39–117)
Bilirubin, Direct: 0.1 mg/dL (ref 0.1–0.3)
Total Bilirubin: 0.5 mg/dL (ref 0.2–1.2)
Total Protein: 7.2 g/dL (ref 6.0–8.3)

## 2024-12-25 NOTE — Telephone Encounter (Signed)
 Left message for pt to call back

## 2024-12-28 ENCOUNTER — Other Ambulatory Visit: Payer: Self-pay

## 2024-12-28 DIAGNOSIS — K754 Autoimmune hepatitis: Secondary | ICD-10-CM
